# Patient Record
Sex: Female | Born: 1965 | Race: Black or African American | Hispanic: No | Marital: Married | Smoking: Never smoker
Health system: Southern US, Community
[De-identification: ages and names within clinical notes are randomized; demographics above are authoritative.]

## PROBLEM LIST (undated history)

## (undated) ENCOUNTER — Emergency Department (HOSPITAL_COMMUNITY): Disposition: A | Discharge status: Home / Self Care | Payer: BC Managed Care – PPO | Source: Home / Self Care

## (undated) DIAGNOSIS — E669 Obesity, unspecified: Secondary | ICD-10-CM

## (undated) DIAGNOSIS — G8929 Other chronic pain: Secondary | ICD-10-CM

## (undated) DIAGNOSIS — R195 Other fecal abnormalities: Secondary | ICD-10-CM

## (undated) DIAGNOSIS — J189 Pneumonia, unspecified organism: Secondary | ICD-10-CM

## (undated) DIAGNOSIS — R109 Unspecified abdominal pain: Secondary | ICD-10-CM

## (undated) DIAGNOSIS — R569 Unspecified convulsions: Secondary | ICD-10-CM

## (undated) DIAGNOSIS — I872 Venous insufficiency (chronic) (peripheral): Secondary | ICD-10-CM

## (undated) DIAGNOSIS — N838 Other noninflammatory disorders of ovary, fallopian tube and broad ligament: Secondary | ICD-10-CM

## (undated) DIAGNOSIS — K219 Gastro-esophageal reflux disease without esophagitis: Secondary | ICD-10-CM

## (undated) HISTORY — PX: CARPAL TUNNEL RELEASE: SHX101

## (undated) HISTORY — DX: Unspecified abdominal pain: R10.9

## (undated) HISTORY — DX: Venous insufficiency (chronic) (peripheral): I87.2

## (undated) HISTORY — DX: Obesity, unspecified: E66.9

## (undated) HISTORY — DX: Gastro-esophageal reflux disease without esophagitis: K21.9

## (undated) HISTORY — DX: Pneumonia, unspecified organism: J18.9

## (undated) HISTORY — DX: Other noninflammatory disorders of ovary, fallopian tube and broad ligament: N83.8

## (undated) HISTORY — DX: Other fecal abnormalities: R19.5

## (undated) HISTORY — PX: OTHER SURGICAL HISTORY: SHX169

## (undated) HISTORY — DX: Unspecified convulsions: R56.9

## (undated) HISTORY — DX: Other chronic pain: G89.29

---

## 1999-01-27 ENCOUNTER — Encounter: Payer: Self-pay | Admitting: Emergency Medicine

## 1999-01-27 ENCOUNTER — Emergency Department (HOSPITAL_COMMUNITY): Admission: EM | Admit: 1999-01-27 | Discharge: 1999-01-27 | Payer: Self-pay | Admitting: Emergency Medicine

## 1999-08-19 ENCOUNTER — Encounter (HOSPITAL_COMMUNITY): Admission: RE | Admit: 1999-08-19 | Discharge: 1999-11-17 | Payer: Self-pay | Admitting: Dentistry

## 1999-08-26 ENCOUNTER — Other Ambulatory Visit: Admission: RE | Admit: 1999-08-26 | Discharge: 1999-08-26 | Payer: Self-pay | Admitting: Family Medicine

## 1999-09-08 ENCOUNTER — Encounter (HOSPITAL_COMMUNITY): Payer: Self-pay | Admitting: Dentistry

## 1999-11-17 ENCOUNTER — Encounter (HOSPITAL_COMMUNITY): Admission: RE | Admit: 1999-11-17 | Discharge: 2000-02-15 | Payer: Self-pay | Admitting: Dentistry

## 2000-03-25 ENCOUNTER — Encounter (HOSPITAL_COMMUNITY): Admission: RE | Admit: 2000-03-25 | Discharge: 2000-06-23 | Payer: Self-pay | Admitting: Dentistry

## 2000-04-02 ENCOUNTER — Emergency Department (HOSPITAL_COMMUNITY): Admission: EM | Admit: 2000-04-02 | Discharge: 2000-04-02 | Payer: Self-pay | Admitting: Emergency Medicine

## 2001-08-06 ENCOUNTER — Emergency Department (HOSPITAL_COMMUNITY): Admission: EM | Admit: 2001-08-06 | Discharge: 2001-08-06 | Payer: Self-pay | Admitting: Emergency Medicine

## 2001-08-06 ENCOUNTER — Encounter: Payer: Self-pay | Admitting: Emergency Medicine

## 2001-09-21 ENCOUNTER — Ambulatory Visit (HOSPITAL_COMMUNITY): Admission: RE | Admit: 2001-09-21 | Discharge: 2001-09-21 | Payer: Self-pay | Admitting: *Deleted

## 2001-09-22 ENCOUNTER — Ambulatory Visit (HOSPITAL_COMMUNITY): Admission: AD | Admit: 2001-09-22 | Discharge: 2001-09-22 | Payer: Self-pay | Admitting: *Deleted

## 2001-09-22 ENCOUNTER — Encounter (INDEPENDENT_AMBULATORY_CARE_PROVIDER_SITE_OTHER): Payer: Self-pay

## 2002-01-07 ENCOUNTER — Emergency Department (HOSPITAL_COMMUNITY): Admission: EM | Admit: 2002-01-07 | Discharge: 2002-01-07 | Payer: Self-pay | Admitting: Emergency Medicine

## 2002-02-13 ENCOUNTER — Encounter: Admission: RE | Admit: 2002-02-13 | Discharge: 2002-03-15 | Payer: Self-pay | Admitting: Orthopedic Surgery

## 2002-10-31 ENCOUNTER — Encounter: Admission: RE | Admit: 2002-10-31 | Discharge: 2003-01-29 | Payer: Self-pay | Admitting: Family Medicine

## 2003-08-12 ENCOUNTER — Encounter: Admission: RE | Admit: 2003-08-12 | Discharge: 2003-08-12 | Payer: Self-pay | Admitting: Family Medicine

## 2003-10-29 ENCOUNTER — Other Ambulatory Visit: Admission: RE | Admit: 2003-10-29 | Discharge: 2003-10-29 | Payer: Self-pay | Admitting: Family Medicine

## 2004-07-28 ENCOUNTER — Ambulatory Visit: Payer: Self-pay | Admitting: Internal Medicine

## 2004-07-28 ENCOUNTER — Encounter (INDEPENDENT_AMBULATORY_CARE_PROVIDER_SITE_OTHER): Payer: Self-pay | Admitting: Internal Medicine

## 2004-07-28 LAB — CONVERTED CEMR LAB: Pap Smear: NORMAL

## 2004-08-03 ENCOUNTER — Emergency Department (HOSPITAL_COMMUNITY): Admission: EM | Admit: 2004-08-03 | Discharge: 2004-08-03 | Payer: Self-pay | Admitting: Family Medicine

## 2004-08-11 ENCOUNTER — Ambulatory Visit: Payer: Self-pay | Admitting: Internal Medicine

## 2004-08-19 ENCOUNTER — Encounter: Admission: RE | Admit: 2004-08-19 | Discharge: 2004-08-19 | Payer: Self-pay | Admitting: Internal Medicine

## 2004-09-16 ENCOUNTER — Encounter (INDEPENDENT_AMBULATORY_CARE_PROVIDER_SITE_OTHER): Payer: Self-pay | Admitting: Internal Medicine

## 2004-10-06 ENCOUNTER — Ambulatory Visit: Payer: Self-pay | Admitting: Internal Medicine

## 2004-12-21 ENCOUNTER — Ambulatory Visit: Payer: Self-pay | Admitting: Internal Medicine

## 2005-01-02 ENCOUNTER — Encounter (INDEPENDENT_AMBULATORY_CARE_PROVIDER_SITE_OTHER): Payer: Self-pay | Admitting: Internal Medicine

## 2005-01-05 ENCOUNTER — Ambulatory Visit (HOSPITAL_COMMUNITY): Admission: RE | Admit: 2005-01-05 | Discharge: 2005-01-05 | Payer: Self-pay | Admitting: Gastroenterology

## 2005-01-06 ENCOUNTER — Ambulatory Visit (HOSPITAL_COMMUNITY): Admission: RE | Admit: 2005-01-06 | Discharge: 2005-01-06 | Payer: Self-pay | Admitting: Gastroenterology

## 2005-01-06 ENCOUNTER — Ambulatory Visit: Payer: Self-pay | Admitting: Internal Medicine

## 2005-01-18 DIAGNOSIS — N838 Other noninflammatory disorders of ovary, fallopian tube and broad ligament: Secondary | ICD-10-CM

## 2005-01-18 HISTORY — DX: Other noninflammatory disorders of ovary, fallopian tube and broad ligament: N83.8

## 2005-01-27 ENCOUNTER — Ambulatory Visit: Payer: Self-pay | Admitting: Cardiology

## 2005-01-27 ENCOUNTER — Ambulatory Visit (HOSPITAL_COMMUNITY): Admission: RE | Admit: 2005-01-27 | Discharge: 2005-01-27 | Payer: Self-pay | Admitting: Internal Medicine

## 2005-01-27 ENCOUNTER — Encounter: Payer: Self-pay | Admitting: Cardiology

## 2005-01-29 ENCOUNTER — Ambulatory Visit: Payer: Self-pay | Admitting: Internal Medicine

## 2005-01-29 ENCOUNTER — Encounter (INDEPENDENT_AMBULATORY_CARE_PROVIDER_SITE_OTHER): Payer: Self-pay | Admitting: Internal Medicine

## 2005-02-01 ENCOUNTER — Ambulatory Visit (HOSPITAL_COMMUNITY): Admission: RE | Admit: 2005-02-01 | Discharge: 2005-02-01 | Payer: Self-pay | Admitting: Internal Medicine

## 2005-06-22 ENCOUNTER — Ambulatory Visit: Payer: Self-pay | Admitting: Internal Medicine

## 2005-08-23 ENCOUNTER — Ambulatory Visit (HOSPITAL_COMMUNITY): Admission: RE | Admit: 2005-08-23 | Discharge: 2005-08-23 | Payer: Self-pay | Admitting: Obstetrics & Gynecology

## 2005-09-16 ENCOUNTER — Ambulatory Visit (HOSPITAL_COMMUNITY): Admission: RE | Admit: 2005-09-16 | Discharge: 2005-09-16 | Payer: Self-pay | Admitting: Obstetrics & Gynecology

## 2005-10-20 ENCOUNTER — Ambulatory Visit (HOSPITAL_COMMUNITY): Admission: RE | Admit: 2005-10-20 | Discharge: 2005-10-20 | Payer: Self-pay | Admitting: Obstetrics & Gynecology

## 2005-11-17 ENCOUNTER — Encounter (INDEPENDENT_AMBULATORY_CARE_PROVIDER_SITE_OTHER): Payer: Self-pay | Admitting: Internal Medicine

## 2005-11-17 DIAGNOSIS — L538 Other specified erythematous conditions: Secondary | ICD-10-CM

## 2005-11-17 DIAGNOSIS — R609 Edema, unspecified: Secondary | ICD-10-CM | POA: Insufficient documentation

## 2005-11-17 DIAGNOSIS — E669 Obesity, unspecified: Secondary | ICD-10-CM

## 2005-11-27 ENCOUNTER — Encounter (INDEPENDENT_AMBULATORY_CARE_PROVIDER_SITE_OTHER): Payer: Self-pay | Admitting: Internal Medicine

## 2006-01-17 ENCOUNTER — Encounter (INDEPENDENT_AMBULATORY_CARE_PROVIDER_SITE_OTHER): Payer: Self-pay | Admitting: Internal Medicine

## 2006-01-25 DIAGNOSIS — N839 Noninflammatory disorder of ovary, fallopian tube and broad ligament, unspecified: Secondary | ICD-10-CM | POA: Insufficient documentation

## 2006-02-13 ENCOUNTER — Inpatient Hospital Stay (HOSPITAL_COMMUNITY): Admission: AD | Admit: 2006-02-13 | Discharge: 2006-02-13 | Payer: Self-pay | Admitting: Obstetrics & Gynecology

## 2006-02-21 ENCOUNTER — Ambulatory Visit: Payer: Self-pay | Admitting: Obstetrics & Gynecology

## 2006-02-26 ENCOUNTER — Inpatient Hospital Stay (HOSPITAL_COMMUNITY): Admission: AD | Admit: 2006-02-26 | Discharge: 2006-02-26 | Payer: Self-pay | Admitting: Obstetrics and Gynecology

## 2006-02-28 ENCOUNTER — Inpatient Hospital Stay (HOSPITAL_COMMUNITY): Admission: RE | Admit: 2006-02-28 | Discharge: 2006-03-05 | Payer: Self-pay | Admitting: Obstetrics and Gynecology

## 2006-02-28 ENCOUNTER — Ambulatory Visit: Payer: Self-pay | Admitting: Obstetrics & Gynecology

## 2006-03-02 ENCOUNTER — Encounter (INDEPENDENT_AMBULATORY_CARE_PROVIDER_SITE_OTHER): Payer: Self-pay | Admitting: *Deleted

## 2006-03-30 ENCOUNTER — Inpatient Hospital Stay (HOSPITAL_COMMUNITY): Admission: AD | Admit: 2006-03-30 | Discharge: 2006-03-30 | Payer: Self-pay | Admitting: Obstetrics and Gynecology

## 2006-03-30 ENCOUNTER — Ambulatory Visit: Payer: Self-pay | Admitting: Obstetrics and Gynecology

## 2006-10-25 ENCOUNTER — Emergency Department (HOSPITAL_COMMUNITY): Admission: EM | Admit: 2006-10-25 | Discharge: 2006-10-25 | Payer: Self-pay | Admitting: Family Medicine

## 2007-02-27 ENCOUNTER — Emergency Department (HOSPITAL_COMMUNITY): Admission: EM | Admit: 2007-02-27 | Discharge: 2007-02-27 | Payer: Self-pay | Admitting: Emergency Medicine

## 2007-08-01 ENCOUNTER — Ambulatory Visit: Payer: Self-pay | Admitting: Internal Medicine

## 2007-08-01 ENCOUNTER — Encounter: Payer: Self-pay | Admitting: Internal Medicine

## 2007-08-01 DIAGNOSIS — M79609 Pain in unspecified limb: Secondary | ICD-10-CM

## 2007-08-01 LAB — CONVERTED CEMR LAB
Albumin: 4.2 g/dL (ref 3.5–5.2)
Potassium: 4.4 meq/L (ref 3.5–5.3)
Total Protein: 7.4 g/dL (ref 6.0–8.3)

## 2008-01-30 ENCOUNTER — Ambulatory Visit: Payer: Self-pay | Admitting: Infectious Disease

## 2008-01-30 ENCOUNTER — Encounter (INDEPENDENT_AMBULATORY_CARE_PROVIDER_SITE_OTHER): Payer: Self-pay | Admitting: Internal Medicine

## 2008-02-01 ENCOUNTER — Encounter (INDEPENDENT_AMBULATORY_CARE_PROVIDER_SITE_OTHER): Payer: Self-pay | Admitting: Internal Medicine

## 2008-02-01 ENCOUNTER — Ambulatory Visit: Payer: Self-pay | Admitting: Internal Medicine

## 2008-02-06 ENCOUNTER — Telehealth (INDEPENDENT_AMBULATORY_CARE_PROVIDER_SITE_OTHER): Payer: Self-pay | Admitting: Internal Medicine

## 2008-02-06 DIAGNOSIS — R7309 Other abnormal glucose: Secondary | ICD-10-CM

## 2008-02-06 LAB — CONVERTED CEMR LAB
ALT: 16 units/L (ref 0–35)
AST: 19 units/L (ref 0–37)
Albumin: 4 g/dL (ref 3.5–5.2)
BUN: 9 mg/dL (ref 6–23)
CO2: 22 meq/L (ref 19–32)
Calcium: 9 mg/dL (ref 8.4–10.5)
Chloride: 106 meq/L (ref 96–112)
Cholesterol: 161 mg/dL (ref 0–200)
Creatinine, Ser: 0.99 mg/dL (ref 0.40–1.20)
Glucose, Bld: 108 mg/dL — ABNORMAL HIGH (ref 70–99)
Potassium: 4.5 meq/L (ref 3.5–5.3)
Sodium: 140 meq/L (ref 135–145)
Total Bilirubin: 0.4 mg/dL (ref 0.3–1.2)

## 2008-02-09 ENCOUNTER — Encounter (INDEPENDENT_AMBULATORY_CARE_PROVIDER_SITE_OTHER): Payer: Self-pay | Admitting: *Deleted

## 2008-02-10 ENCOUNTER — Emergency Department (HOSPITAL_COMMUNITY): Admission: EM | Admit: 2008-02-10 | Discharge: 2008-02-10 | Payer: Self-pay | Admitting: Emergency Medicine

## 2008-02-14 ENCOUNTER — Ambulatory Visit: Payer: Self-pay | Admitting: Internal Medicine

## 2008-02-14 LAB — CONVERTED CEMR LAB: Hgb A1c MFr Bld: 5 %

## 2008-07-30 ENCOUNTER — Observation Stay (HOSPITAL_COMMUNITY): Admission: EM | Admit: 2008-07-30 | Discharge: 2008-07-31 | Payer: Self-pay | Admitting: Emergency Medicine

## 2008-07-30 ENCOUNTER — Telehealth (INDEPENDENT_AMBULATORY_CARE_PROVIDER_SITE_OTHER): Payer: Self-pay | Admitting: *Deleted

## 2008-11-22 ENCOUNTER — Other Ambulatory Visit: Admission: RE | Admit: 2008-11-22 | Discharge: 2008-11-22 | Payer: Self-pay | Admitting: Family Medicine

## 2009-04-14 ENCOUNTER — Encounter: Payer: Self-pay | Admitting: Internal Medicine

## 2009-04-15 ENCOUNTER — Ambulatory Visit: Payer: Self-pay | Admitting: Internal Medicine

## 2009-04-15 LAB — CONVERTED CEMR LAB
Anticardiolipin IgA: 4 (ref <22)
Anticardiolipin IgM: 0 (ref <11)

## 2009-04-23 LAB — CONVERTED CEMR LAB
ANA Titer 1: 1:40 {titer} — ABNORMAL HIGH
Albumin: 4.2 g/dL (ref 3.5–5.2)
CO2: 22 meq/L (ref 19–32)
CRP: 1.2 mg/dL — ABNORMAL HIGH (ref <0.6)
Calcium: 9.5 mg/dL (ref 8.4–10.5)
Chloride: 103 meq/L (ref 96–112)
HCT: 39.5 % (ref 36.0–46.0)
MCHC: 32.4 g/dL (ref 30.0–36.0)
MCV: 84.8 fL (ref >=78.0)
Potassium: 4.2 meq/L (ref 3.5–5.3)
Sodium: 138 meq/L (ref 135–145)
Total Bilirubin: 0.7 mg/dL (ref 0.3–1.2)

## 2009-05-07 ENCOUNTER — Ambulatory Visit (HOSPITAL_COMMUNITY): Admission: RE | Admit: 2009-05-07 | Discharge: 2009-05-07 | Payer: Self-pay | Admitting: Internal Medicine

## 2009-05-19 ENCOUNTER — Telehealth (INDEPENDENT_AMBULATORY_CARE_PROVIDER_SITE_OTHER): Payer: Self-pay | Admitting: Internal Medicine

## 2009-07-01 ENCOUNTER — Ambulatory Visit: Payer: Self-pay | Admitting: Internal Medicine

## 2009-08-20 ENCOUNTER — Ambulatory Visit: Payer: Self-pay | Admitting: Internal Medicine

## 2009-08-26 ENCOUNTER — Ambulatory Visit: Payer: Self-pay | Admitting: Internal Medicine

## 2010-02-07 ENCOUNTER — Encounter: Payer: Self-pay | Admitting: Internal Medicine

## 2010-02-09 ENCOUNTER — Encounter: Payer: Self-pay | Admitting: Emergency Medicine

## 2010-02-11 ENCOUNTER — Ambulatory Visit: Admission: RE | Admit: 2010-02-11 | Discharge: 2010-02-11 | Payer: Self-pay | Source: Home / Self Care

## 2010-02-11 DIAGNOSIS — B351 Tinea unguium: Secondary | ICD-10-CM | POA: Insufficient documentation

## 2010-02-12 ENCOUNTER — Emergency Department (HOSPITAL_COMMUNITY)
Admission: EM | Admit: 2010-02-12 | Discharge: 2010-02-13 | Payer: Self-pay | Source: Home / Self Care | Admitting: Emergency Medicine

## 2010-02-12 LAB — POCT I-STAT, CHEM 8
Calcium, Ion: 1.18 mmol/L (ref 1.12–1.32)
Chloride: 104 mEq/L (ref 96–112)
Hemoglobin: 12.9 g/dL (ref 12.0–15.0)
TCO2: 27 mmol/L (ref 0–100)

## 2010-02-12 LAB — POCT CARDIAC MARKERS
CKMB, poc: <1 ng/mL — ABNORMAL LOW (ref 1.0–8.0)
Myoglobin, poc: 72.7 ng/mL (ref 12–200)

## 2010-02-12 LAB — CBC
Hemoglobin: 11.3 g/dL — ABNORMAL LOW (ref 12.0–15.0)
MCH: 27 pg (ref 26.0–34.0)
Platelets: 353 10*3/uL (ref 150–400)
RBC: 4.19 MIL/uL (ref 3.87–5.11)
RDW: 13.8 % (ref 11.5–15.5)

## 2010-02-12 LAB — DIFFERENTIAL
Eosinophils Absolute: 0.1 10*3/uL (ref 0.0–0.7)
Eosinophils Relative: 1 % (ref 0–5)
Lymphocytes Relative: 49 % — ABNORMAL HIGH (ref 12–46)
Lymphs Abs: 3.4 10*3/uL (ref 0.7–4.0)
Neutro Abs: 3 10*3/uL (ref 1.7–7.7)
Neutrophils Relative %: 44 % (ref 43–77)

## 2010-02-17 NOTE — Assessment & Plan Note (Signed)
Summary: EXTREME M/O CLASS/CH   Allergies: 1)  ! * Shrimp 2)  ! Margette Fast Sauce Patient attended a 1 hour [employer] Makeover: Lifestyle meeting today. The meeting included information about: healthy food choices - more fruits and vegetables, healthy fats, healthy snacks on a budget- made trail mix and hand weights for them take home today. .   Please ask patient what they learned at their next visit.   Thank you for the referral.   Complete Medication List: 1)  Womens Multivitamin Plus Tabs (Multiple vitamins-minerals) .... Take 1 tablet by mouth once a day  Other Orders: No Charge Patient Arrived (NCPA0) (NCPA0)

## 2010-02-17 NOTE — Miscellaneous (Signed)
Summary: PPD 2007 negative  vaccine record   Imported By: Margie Billet 01/17/2006 15:59:59  _____________________________________________________________________  External Attachment:    Type:   Image     Comment:   External Document

## 2010-02-17 NOTE — Assessment & Plan Note (Signed)
Summary: FEET SWELLING/GHERGHE/VS   Vital Signs:  Patient Profile:   45 Years Old Female Weight:      327.1 pounds (148.68 kg) Temp:     98.4 degrees F (36.89 degrees C) oral Pulse rate:   82 / minute BP sitting:   126 / 83  (right arm)  Pt. in pain?   yes    Location:   2-3    Intensity:   2-3  Vitals Entered By: Stanton Kidney Ditzler RN (August 01, 2007 10:56 AM)              Is Patient Diabetic? No Nutritional Status Detail appetite good  Have you ever been in a relationship where you felt threatened, hurt or afraid?denies   Does patient need assistance? Functional Status Self care Ambulation Normal     Chief Complaint:  Legs swollen. Heel pain past 2 months. Raw on right side low abd in folds..  History of Present Illness: 45 yo female with PMH of obesity, lower extremity edema (extensive work up done, 2D ECHO LVEF 55% - 65%) came in with two concerns. First concern is bilateral heel pain since 2 months ago. Pain is continuous, nonradiating, and 5/10 in severity. It is worse with proloned walking, exercise, and at night time and it gets better with rest and ibuprofen. She has never had this pain before but reports gaining weight (about 50 lbs) in last couple of years after her pregnancy and she believes that weight makes it worse. She denies history of recent trauma to the feeet. She reports no difficulty walking, muscle weakness, no fever, chills, no chest pain, shortness of breath, dyspnea on exertion, nausea or vomiting.  Her second concern is lower extremity swelling that has been present for 2-3 years. It comes and goes. 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 She denies shortness of breath, chest pain, diaphoresis.    Current Allergies (reviewed today): ! * SHRIMP ! * TERIYAKI SAUCE  Past Medical History:    Abd. pain (once a month, lasts for 3-4 days-could have been related to IUD,  multiple visits for this)     Lower extremity edema worked up extensively (nl 2D ECHO LVEF 55 - 65%, nl LFTs, nl renal fxn) felt 2nd to vv insuff, but patient refused compression stockings    Obesity    Candidal intertrigo-under breasts    Left ovary enlargement 4.7/3 cm on CT 01/2005    Miscarriage at 3 mth 2003    GERD    h/o heme positive stool - Colonoscopy done, Barium enema 12/2004: diverticulosis in sigmoid colon, EGD     12/2004: normal   Social History:    lives at home with kids, currently working as bus driver   Risk Factors:  Tobacco use:  never Passive smoke exposure:  no Drug use:  no Alcohol use:  no  PAP Smear History:    Date of Last PAP Smear:  07/28/2004   Review of Systems  The patient denies fever, weight loss, weight gain, chest pain, syncope, dyspnea on exertion, headaches, abdominal pain, melena, muscle weakness, suspicious skin lesions, difficulty walking, and unusual weight change.         Patient gained weight since her pregnancy but no recent weight gain.   Physical Exam  General:     alert, normal appearance, and overweight-appearing.   Lungs:     normal respiratory  effort, no intercostal retractions, no accessory muscle use, normal breath sounds, no crackles, and no wheezes.   Heart:     normal rate, regular rhythm, no murmur, no gallop, no rub, and no JVD.   Abdomen:     soft, non-tender, and normal bowel sounds.   Msk:     normal ROM, no joint tenderness, no joint swelling, no joint warmth, no redness over joints, and no joint instability.   Pulses:     R posterior tibial normal, R dorsalis pedis normal, L posterior tibial normal, and L dorsalis pedis normal.   Extremities:     1+ left pedal edema, left pretibial edema, 1+ right pedal edema, and right pretibial edema.   Neurologic:     alert & oriented X3, cranial nerves II-XII intact, strength normal in all extremities, sensation intact to light touch, sensation intact to pinprick, and  gait normal.   Skin:     turgor normal, color normal, no rashes, and no suspicious lesions.   Inguinal Nodes:     no R inguinal adenopathy and no L inguinal adenopathy.   Psych:     Oriented X3, normally interactive, good eye contact, and not anxious appearing.      Impression & Recommendations:  Problem # 1:  HEEL PAIN, BILATERAL (ICD-729.5) Patient reports that this is new pain that has been going on for about 2 months now. It is continuous and nonradiation, gets worse with prolonged standing and it is relieved by rest and Adivl. On physical exam, there is full range of motion in lower extremities and strength 5/5. Dorsalis pedis and posterior tibial pulses are present bilaterally. There is pretibial swelling, +1 edema bilaterally. There is  no sign of trauma to the feet andno signs of infection (no redness or swelling). Patient was advised to wear comfortable shoes, avoiding flip-flops, and to take ibuprofen as needed for pain. I will see her in clinic in 2 months for follow up.  Problem # 2:  LEG EDEMA, BILATERAL (ICD-782.3) Patient has been having bilateral leg swelling for 2-3 years and she explains that it has been about same, sometimes gets worse with prolonged standing and sitting and it gets better with rest. She had extensive workup done in 2007, 2D ECHO showed LVEF of 55% - 65%, there was presumptive diagnosis of venous insufficiency but I was not able to find any records for this. Today she reports no chest pain or shortness of breath, no fever or chills. I have ordered c-met and TSH to recheck the leves becasue it has been 2 years when she last had it done. I have discussed elevation of the legs, use of compression stockings, and sodium restiction in her diet. I will see patient in 2 months and will follow up with her swelling.  Orders: T-Comprehensive Metabolic Panel ([id number]) T-TSH [id number])   Complete Medication List: 1)  [facility] Ibuprofen 200 Mg Tabs (Ibuprofen) ....  Take 2-3 tablets by mouth as needed for pain 2)  Clotrimazole-betamethasone 1-0.05 % Crea (Clotrimazole-betamethasone) .... Apply tothe area as needed   Patient Instructions: 1)  Please schedule a follow-up appointment in 2 months. 2)  Use compression stockings because they can help with the swelling. Also, use comfortable shoes at work or for exercise and avoid flip-flops.Marland Kitchen 3)  If the pain gets worse please come back to the clinic.   Prescriptions: CLOTRIMAZOLE-BETAMETHASONE 1-0.05 %  CREA (CLOTRIMAZOLE-BETAMETHASONE) apply tothe area as needed  #4 x 0   Entered and Authorized by:  Mliss Sax MD   Signed by:   Mliss Sax MD on 08/01/2007   Method used:   Print then Give to Patient   RxID:   [id number] [facility] IBUPROFEN 200 MG  TABS (IBUPROFEN) take 2-3 tablets by mouth as needed for pain  #90 x 0   Entered and Authorized by:   Mliss Sax MD   Signed by:   Mliss Sax MD on 08/01/2007   Method used:   Print then Give to Patient   RxID:   [id number]  ]

## 2010-02-17 NOTE — Assessment & Plan Note (Signed)
Summary: est-ck/fu/meds/cfb   Vital Signs:  Patient profile:   45 year old female Height:      65.25 inches (165.74 cm) Weight:      320.7 pounds (145.77 kg) BMI:     53.15 Temp:     97.7 degrees F (36.50 degrees C) oral Pulse rate:   68 / minute BP sitting:   133 / 88  (right arm)  Vitals Entered By: Stanton Kidney Ditzler RN (April 15, 2009 11:13 AM) Is Patient Diabetic? No Pain Assessment Patient in pain? no      Nutritional Status BMI of > 30 = obese Nutritional Status Detail appetite good  Have you ever been in a relationship where you felt threatened, hurt or afraid?denies   Does patient need assistance? Functional Status Self care Ambulation Normal Comments FU left leg. Past 3 weeks nonproductive cough.   Primary Care Provider:  Carlus Pavlov MD   History of Present Illness: Pt is a 45 yo female w/ past med hx below here for vein problems in her legs.  She noticed this area about 3 weeks ago when she was getting out of the shower.  Skin is discolored on the lateral aspect of her L calf.  The area is not painful, no tingling or numbness, no fevers or other systemic complaints.  She is upset about the way the area looks cosmetically.  She has no other complaints.    She is due for her pap smear and [facility]  Depression History:      The patient denies a depressed mood most of the day and a diminished interest in her usual daily activities.         Preventive Screening-Counseling & Management  Alcohol-Tobacco     Smoking Status: never     Passive Smoke Exposure: no  Current Medications (verified): 1)  None  Allergies: 1)  ! * Shrimp 2)  ! * Teriyaki Sauce  Past History:  Past Medical History: Last updated: 08/01/2007 Abd. pain (once a month, lasts for 3-4 days-could have been related to IUD, multiple visits for this)  Lower extremity edema worked up extensively (nl 2D ECHO LVEF 55 - 65%, nl LFTs, nl renal fxn) felt 2nd to vv insuff, but  patient refused compression stockings Obesity Candidal intertrigo-under breasts Left ovary enlargement 4.7/3 cm on CT 01/2005 Miscarriage at 3 mth 2003 GERD h/o heme positive stool - Colonoscopy done, Barium enema 12/2004: diverticulosis in sigmoid colon, EGD     12/2004: normal  Past Surgical History: Last updated: 11/17/2005 Caesarean section Carpal tunnel release  Social History: Last updated: 01/30/2008 lives at home with kids, currently working as bus Musician.  No smoking, alcohol or illegal drugs.  She is single.   Social History: Reviewed history from 01/30/2008 and no changes required. lives at home with kids, currently working as bus Musician.  No smoking, alcohol or illegal drugs.  She is single.   Review of Systems       as per hpi.  Physical Exam  General:  alert, oriented, obese, no distress.  Eyes:  anicteric.  Lungs:  CTA bilaterally, no resp effort.  Heart:  RRR, no m/r/g. Abdomen:  +BS's, soft, obese, NT and ND.  Extremities:  trace lower extremity edema.  2+ DP pulses.  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  Psych:  mood euthymic.  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  Could be related to mod size venous dilation.  However, per up to date, this is associated w/ various autoimmune deficiencies.  Will screen for lupus, APA syn, rheumatic dz, check inflammatory markers, f/u CBC, adn CMET to investigate possible systemic causes.  If it is neg, could consider bx to confirm dx vs observation.  Will f/u in 2 weeks and go over labs.     Orders: T-Sed Rate (Automated) [id number]) T-C-Reactive Protein [id number]) T-Antinuclear Antib (ANA) [phone]) T- * Misc. Laboratory test [id number]) T-Rheumatoid Factor [phone]) T-CBC No Diff  [id number]) T-Comprehensive Metabolic Panel [id number]) T- * Misc. Laboratory test [phone])  Problem # 2:  Preventive Health Care (ICD-V70.0) [facility]  Reminded her she is overdue.  Mammogram order placed.   Other Orders: Mammogram (Screening) (Mammo)  Patient Instructions: 1)  Please make a followup appointment in 2 weeks. 2)  Please apply TED hose to your leg to see if this helps.  Process Orders Check Orders Results:     [facility]: ABN not required for this insurance Tests Sent for requisitioning (April 16, 2009 12:17 PM):     04/15/2009: [facility] -- T-Sed Rate (Automated) [[id number]] (signed)     04/15/2009: [facility] -- T-C-Reactive Protein [id number] (signed)     04/15/2009: [facility] -- T-Antinuclear Antib (ANA) [[id number]] (signed)     04/15/2009: [facility] -- T- * Misc. Laboratory test [[id number]] (signed)     04/15/2009: [facility] -- T-Rheumatoid Factor [phone] (signed)     04/15/2009: [facility] -- T-CBC No Diff [[id number]] (signed)     04/15/2009: [facility] -- T-Comprehensive Metabolic Panel [[id number]] (signed)     04/15/2009: [facility] -- T- * Misc. Laboratory test [phone] (signed)    Prevention & Chronic Care Immunizations   Influenza vaccine: Not documented    Tetanus booster: Not documented    Pneumococcal vaccine: Not documented  Other Screening   Pap smear: Normal  (07/28/2004)   Pap smear action/deferral: Deferred  (04/15/2009)    Mammogram: Not documented   Mammogram action/deferral: Ordered  (04/15/2009)   Smoking status: never  (04/15/2009)  Lipids   Total Cholesterol: 161  (02/01/2008)   LDL: 99  (02/01/2008)   LDL Direct: Not documented   HDL: 33  (02/01/2008)   Triglycerides: 143  (02/01/2008)      Resource handout printed.   Nursing  Instructions: Schedule screening mammogram (see order)

## 2010-02-17 NOTE — Letter (Signed)
Summary: SCREENING MAMMOGRAM  SCREENING MAMMOGRAM   Imported By: Margie Billet 04/15/2009 08:55:59  _____________________________________________________________________  External Attachment:    Type:   Image     Comment:   External Document  Appended Document: SCREENING MAMMOGRAM    Clinical Lists Changes  Observations: Added new observation of MAMMO DUE: 08/2005 (04/15/2009 13:24) Added new observation of MAMMOGRAM: No specific mammographic evidence of malignancy.   (08/19/2004 13:25)       Mammogram  Procedure date:  08/19/2004  Findings:      No specific mammographic evidence of malignancy.    Procedures Next Due Date:    Mammogram: 08/2005   Mammogram  Procedure date:  08/19/2004  Findings:      No specific mammographic evidence of malignancy.    Procedures Next Due Date:    Mammogram: 08/2005

## 2010-02-17 NOTE — Assessment & Plan Note (Signed)
Summary: tb inj/cfb  Nurse Visit   Allergies: 1)  ! * Shrimp 2)  ! * Teriyaki Sauce  Immunizations Administered:  PPD Skin Test:    Vaccine Type: PPD    Site: right forearm    Mfr: Sanofi Pasteur    Dose: 0.05 ml    Route: ID    Given by: [email or web address]    Exp. Date: 10/31/2010    Lot #: [id number]  Orders Added: 1)  TB Skin Test [86580] 2)  Admin 1st Vaccine [90471] placed 07/01/2009 [facility]  Orders Added: 1)  TB Skin Test [86580] 2)  Admin 1st Vaccine [90471]    PPD Results    Date of reading: 07/03/2009    Results: < 5mm    Interpretation: negative

## 2010-02-17 NOTE — Assessment & Plan Note (Signed)
Summary: ACUTE-SWELLING IN FEET/WANTS REF TO GYN/BOWERS/CFB   Vital Signs:  Patient profile:   45 year old female Height:      65.25 inches (165.74 cm) Weight:      324.4 pounds (147.45 kg) BMI:     53.76 Temp:     98.2 degrees F (36.78 degrees C) oral Pulse rate:   84 / minute BP sitting:   103 / 66  (left arm)  Vitals Entered By: Stanton Kidney Ditzler RN (August 20, 2009 10:19 AM) Is Patient Diabetic? No Pain Assessment Patient in pain? no      Nutritional Status BMI of > 30 = obese Nutritional Status Detail appetite good  Have you ever been in a relationship where you felt threatened, hurt or afraid?denies   Does patient need assistance? Functional Status Self care Ambulation Normal Comments Past 4 months - swelling and pain both feet. Needs referral [facility] GYN clinic for BTL.   Primary Care Provider:  Carlus Pavlov MD   History of Present Illness: Patient is a 45 y/o woman with no major medical problems who come with complaint of recent increase in swelling in her feet.  For the past 13 years or so the patient has had trouble with swelling in her feet and ankles.  It has periods of being better, worse in the summer. Three years ago she regained 70 pounds that she lost after hainvg a baby and her feet have been worse since then.   The last 4 months it has been worse.   Pain is worse at night when she gets up in the middle of the night, feels like a knife, at times accompanied by tingling.  Pain is symmetric bilaterally.  The swelling is prediominatly in her feet and ankles and is equal bilaterally.  Worse if she stays on her feet all day. Patient feels some relief from heel pain by rubbing frozen can under bottom of feet.  Patient has not tried elevating feet, says she doesn't have much time for that.  When she wakes up in the morning the swelling is improved.   She has been told in the past to try compression stockings, but she won't do that until she is "older, maybe in my 45s".  No  SOB, CP, abdominal pain, N/V, headaches, trouble sleeping, fevers, cold symptoms, lost weight, night sweats, or joint pain.    Patient has h/o carpal tunnel in both hands and did experience some numbness and tingling in both hands once recently but she attributes this to sleeping on them wrong.    Patient would like to start exercising (just got membership at the [facility]) and is going to rejoin weight watchers as she would like to lose weight; she believes this would not only help her current problem but also help prevent the development of future medical problems.  She is concerned because her mother had DM and HTN and died of renal failure 2/2 diabetes.    Also, patient would like to get her tubes tied and would like a referral to gyn for this.    Depression History:      The patient denies a depressed mood most of the day and a diminished interest in her usual daily activities.         Preventive Screening-Counseling & Management  Alcohol-Tobacco     Smoking Status: never     Passive Smoke Exposure: no  Current Problems (verified): 1)  Multiparity  (ICD-V61.5) 2)  Screening Examination For Pulmonary Tuberculosis  (  ICD-V74.1) 3)  Hyperglycemia  (ICD-790.29) 4)  Heel Pain, Bilateral  (ICD-729.5) 5)  Disease, Noninflammatory, Ovary/tube Nos  (ICD-620.9) 6)  Intertrigo, Candidal  (ICD-695.89) 7)  Obesity Nos  (ICD-278.00) 8)  Leg Edema, Bilateral  (ICD-782.3)  Current Medications (verified): 1)  Womens Multivitamin Plus  Tabs (Multiple Vitamins-Minerals) .... Take 1 Tablet By Mouth Once A Day  Allergies: 1)  ! * Shrimp 2)  ! Bud Face  Past History:  Past Surgical History: Last updated: 11/17/2005 Caesarean section Carpal tunnel release  Past Medical History: Abd. pain (once a month, lasts for 3-4 days-could have been related to IUD, multiple visits for this, now resolved)  Lower extremity edema worked up extensively (nl 2D ECHO LVEF 55 - 65%, nl LFTs, nl renal fxn)  felt 2nd to vv insuff, but patient refused compression stockings Obesity Candidal intertrigo-under breasts Left ovary enlargement 4.7/3 cm on CT 01/2005 Miscarriage at 3 mth 2003 GERD h/o heme positive stool - Colonoscopy done, Barium enema 12/2004: diverticulosis in sigmoid colon, EGD     12/2004: normal  Family History: Family History Kidney disease (renal failure 2/2 diabetes, mother) Family History of Cardiovascular disorder (CHF, father(from alcoholism) and mother) Family History Hypertension (mother and father) Family History of Rheumatoid Arthritis (mother)  Social History: Lives at home with 95 year old child, currently working as school bus Musician.  Has a 22 y/o son who just graduated from [employer].  The patient is going to school for a business degree.  No smoking, alcohol or illegal drugs.  She is single and has not been sexually active since the birth of her 45 year-old.   Review of Systems       per HPI  Physical Exam  General:  alert, appropriate dress, normal appearance, cooperative to examination, and overweight-appearing.   Head:  no abnormalities observed.   Eyes:  pupils equal, pupils round, and pupils reactive to light.   Ears:  no external deformities.   Nose:  no external deformity, no external erythema, and no nasal discharge.   Mouth:  pharynx pink and moist and teeth missing.   Neck:  supple and full ROM.   Lungs:  normal respiratory effort, no accessory muscle use, normal breath sounds, no crackles, and no wheezes.   Heart:  normal rate, regular rhythm, and no murmur.   Abdomen:  soft, non-tender, and normal bowel sounds.   Msk:  normal ROMand no joint tenderness.   Extremities:  symmetric trace bilateral pedal edema with symmetric 1+ pitting pretibial edema bilaterally.  Some tenderness to palpation on medial aspect of heel; otherwise non-tender.  no posterior calf tenderness. Neurologic:  alert & oriented X3, strength normal in all extremities, and  sensation intact to light touch.   Psych:  Oriented X3, normally interactive, and good eye contact.     Impression & Recommendations:  Problem # 1:  LEG EDEMA, BILATERAL (ICD-782.3) Patient has h/o long-standing bilateral, symmetric, non-tender lower leg edema.  She had a work-up for this including 2D-ECHO in 2007 that showed normal cardiac function.  She was counseled to try to decrease the amount of salt in her diet and elevate her legs when possible.  Compression stockings were suggested, however she is not interested in using these.    Problem # 2:  OBESITY NOS (ICD-278.00) Patient has gained approximately 4 pounds since she was last seen in the clinic in March 2011.  She understands the effects of her weight on her current and future health,  and she is motivated to make a change.  She lost 76 pounds before becoming pregnant with her last child, and she is confident that she can do this again.  She has already joined and started working out at [employer], and she is planning on joining [employer].  She has been to [facility] healthy living class before in 2010; she is interested in attending this again. She is to follow-up with me in 2-3 months so that we can track her progress and start setting some goals for her weight loss.  Problem # 3:  HEEL PAIN, BILATERAL (ICD-729.5) The patient has a history of bilateral heel pain that started after regained 76 pounds after the birth of her second child 3 years ago.  Her pain will likely improve with weight loss (see previous problem), however in the meantime she was instructed to use heat or ice as per her preference and to try ibuprofen.    Problem # 4:  MULTIPARITY (ICD-V61.5) Patient would like to have her tubes tied as she is not interested in having any more children.  She currently has an IUD in place, but she states that she would like a permanent method of birth control.  She was given a referral to a gynecologist for evaluation for  BTL.    Orders: Gynecologic Referral (Gyn)  Problem # 5:  Screening Cervical Cancer (ICD-V76.2) The patient's last Pap smear was approximately 2 years ago (per patient report, in our records, her last pap was in 2006); if the pap is not performed at the gyne appointment, then she will need a pap smear. It seems that she has been to the health department for her pap smears in the past; if she would prefer to return there, that is fine, however, we can take care of this the next time she comes to clinic in 2-3 months for follow-up.  Complete Medication List: 1)  Womens Multivitamin Plus Tabs (Multiple vitamins-minerals) .... Take 1 tablet by mouth once a day  Patient Instructions: 1)  Today you were evaluated for swelling in your legs and pain in your heels.  We recommend that you try to reduce the amount of salt in your diet, try to elevate your feet, and use heat or ice if you have pain in your heels.   2)  We have here at the [facility] a healthy lifestyles class run by Jam[facility] [employer]ighbor; please speak with the folks at the front desk about attending one of these classes. 3)  Please follow-up with me in 2-3 months so that we can monitor how you are doing with regards to your leg swelling and weight loss.  4)  We will provide you with a referral to an OB/GYN physician for your bilateral tubal ligation.   5)  It is important that you exercise regularly at least 20 minutes 5 times a week. If you develop chest pain, have severe difficulty breathing, or feel very tired , stop exercising immediately and seek medical attention. 6)  You need to lose weight. Consider a lower calorie diet and regular exercise.    Prevention & Chronic Care Immunizations   Influenza vaccine: Not documented    Tetanus booster: Not documented    Pneumococcal vaccine: Not documented  Other Screening   Pap smear: Normal  (07/28/2004)   Pap smear action/deferral: Deferred  (04/15/2009)    Mammogram: ASSESSMENT: Negative - BI-RADS  1^MS DIGITAL SCREENING  (05/07/2009)   Mammogram action/deferral: Ordered  (04/15/2009)  Mammogram due: 08/2005   Smoking status: never  (08/20/2009)  Lipids   Total Cholesterol: 161  (02/01/2008)   LDL: 99  (02/01/2008)   LDL Direct: Not documented   HDL: 33  (02/01/2008)   Triglycerides: 143  (02/01/2008)      Resource handout printed.

## 2010-02-17 NOTE — Progress Notes (Signed)
  Phone Note Outgoing Call   Summary of Call: I spoke w/ pt about her lab results.  I spoke w/ Dr. Mayford Knife w/ [facility] dermatology about pt's exam and he suggested that she may have numular eczema as opposed to levido reticularis and recommended a trial of lidex cream.  I will send this in for the pt.  I informed her of her nl workup except slightly + ana but noted she has no other abnormalities to suggest lupus so we do not need to further investigate this right now.  Initial call taken by: Joaquin Courts  MD,  May 19, 2009 8:50 AM  New Problems: ? of LIVEDO RETICULARIS (ICD-782.61)   New Problems: ? of LIVEDO RETICULARIS (ICD-782.61) New/Updated Medications: FLUOCINONIDE 0.05 % CREA (FLUOCINONIDE) Apply to affected area twice a day. Prescriptions: FLUOCINONIDE 0.05 % CREA (FLUOCINONIDE) Apply to affected area twice a day.  #45 g x 0   Entered and Authorized by:   Joaquin Courts  MD   Signed by:   Joaquin Courts  MD on 05/19/2009   Method used:   Electronically to        [facility] [phone]* (retail)       [address]       [place], [place]  [address]       Ph: [phone]       Fax: [phone]   RxID:   [id number]

## 2010-02-18 ENCOUNTER — Other Ambulatory Visit: Payer: Self-pay | Admitting: *Deleted

## 2010-02-19 NOTE — Assessment & Plan Note (Addendum)
Summary: ACUTE-KNEE PAIN F/U/CFB([facility])/CFB   Vital Signs:  Patient profile:   45 year old female Height:      65.25 inches (165.74 cm) Weight:      321.2 pounds (146.00 kg) BMI:     53.23 Temp:     97.6 degrees F (36.44 degrees C) oral Pulse rate:   77 / minute BP sitting:   123 / 84  (left arm)  Vitals Entered By: Stanton Kidney Ditzler RN (February 11, 2010 11:10 AM) Is Patient Diabetic? No Pain Assessment Patient in pain? yes     Location: below left knee Intensity: 7 Type: aching Onset of pain  past month Nutritional Status BMI of > 30 = obese Nutritional Status Detail appetite good  Have you ever been in a relationship where you felt threatened, hurt or afraid?denies   Does patient need assistance? Functional Status Self care Ambulation Normal Comments Ck left leg below knee - gives out and aching past month.   Primary Care Provider:  Danelle Berry, MD   History of Present Illness: 45 y/o w with no major PMH comes to the clinic c/o foot pain and leg pain  This has been going since 3 monhts. 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     Depression History:      The patient denies a depressed mood most of the day and a diminished interest in her usual daily activities.         Preventive Screening-Counseling & Management  Alcohol-Tobacco     Smoking Status: never     Passive Smoke Exposure: no  Current Medications (verified): 1)  Womens Multivitamin Plus  Tabs  (Multiple Vitamins-Minerals) .... Take 1 Tablet By Mouth Once A Day 2)  Itraconazole 100 Mg Caps (Itraconazole) .... 2 Tablets Daily For 12 Weeks  Allergies: 1)  ! * Shrimp 2)  ! * Teriyaki Sauce  Review of Systems  The patient denies anorexia, fever, weight loss, weight gain, vision loss, decreased hearing, hoarseness, chest pain, syncope, dyspnea on exertion, peripheral edema, prolonged cough, headaches, hemoptysis, abdominal pain, melena, hematochezia, severe indigestion/heartburn, hematuria, incontinence, genital sores, muscle weakness, suspicious skin lesions, transient blindness, difficulty walking, depression, unusual weight change, abnormal bleeding, enlarged lymph nodes, angioedema, breast masses, and testicular masses.    Physical Exam  General:  Gen: VS reveiwed, Alert, well developed, nodistress ENT: mucous membranes pink & moist. No abnormal finds in ear and nose. CVC:S1 S2 , no murmurs, no abnormal heart sounds. Lungs: Clear to auscultation B/L. No wheezes, crackles or other abnormal sounds Abdomen: soft, non distended, no tender. Normal Bowel sounds EXT: b/l swollen ankle, , no engorged veins, Pulsations normal good ROM on no tnederness in the affected leg. no redness or rashes. b/l toenail fungal infection  Neuro:alert, oriented *3, cranial nerved 2-12 intact, strenght normal in all  extremities, senstations normal to light touch.      Impression & Recommendations:  Problem # 1:  FOOT PAIN, LEFT (  ICD-729.5) given her h/o b/l heel pain, foot pain and leg pain now, and obesity- musculoskeletal pain is more likely,. she had unrevealing ultrasound and test for lupus in the past. She does have some flat foot on physical exam although I am not sure how much of this is causing her leg pain. Will refer her to sports medicine for assessing the need for orthotics. Also counselled for wt reduction which she is trying hard to.  Continue ibuporfen as needed for pain as it is helping  her now.   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   Her updated medication list for this problem includes:    Itraconazole 100 Mg Caps (Itraconazole) .Marland Kitchen... 2 tablets daily for 12 weeks  Complete Medication List: 1)  Womens Multivitamin Plus Tabs (Multiple vitamins-minerals) .... Take 1 tablet by mouth once a day 2)  Itraconazole 100 Mg Caps (Itraconazole) .... 2 tablets daily for 12 weeks  Patient Instructions: 1)  Please schedule a follow-up appointment in 1 month. Prescriptions: ITRACONAZOLE 100 MG CAPS (ITRACONAZOLE) 2 tablets daily for 12 weeks  #168 x 0   Entered and Authorized by:   Bethel Born MD   Signed by:   Bethel Born MD on 02/11/2010   Method used:   Electronically to        [facility] [phone]* (retail)       [address]       [place], [place]  [address]       Ph: [phone]       Fax: [phone]   RxID:   [id number]    Orders Added: 1)  Est. Patient Level IV [27253] 2)  Sports Medicine [Sports Med]     Prevention & Chronic Care Immunizations   Influenza vaccine: Not documented    Tetanus booster: Not documented    Pneumococcal vaccine: Not documented  Other Screening   Pap smear: Normal  (07/28/2004)   Pap smear action/deferral: Deferred  (04/15/2009)    Mammogram: ASSESSMENT: Negative - BI-RADS 1^MS DIGITAL SCREENING  (05/07/2009)   Mammogram action/deferral: Ordered  (04/15/2009)   Mammogram due: 08/2005   Smoking status: never  (02/11/2010)  Lipids   Total Cholesterol: 161  (02/01/2008)   LDL: 99  (02/01/2008)   LDL Direct: Not documented   HDL: 33  (02/01/2008)   Triglycerides: 143  (02/01/2008)      Resource handout printed.  Appended Document: ACUTE-KNEE PAIN F/U/CFB([facility])/CFB patient could not afford lamisal or itraconazole. health department do not have any other on formulary.  will holdof on treatment at this time   Clinical Lists Changes       Appended Document: ACUTE-KNEE PAIN F/U/CFB([facility])/CFB Pt can not afford itraconazole, $2,000.  Dr Scot Dock informed and said there is not another medicine for this condition. Pt will f/u with her PCP

## 2010-02-21 ENCOUNTER — Emergency Department (HOSPITAL_COMMUNITY)
Admission: EM | Admit: 2010-02-21 | Discharge: 2010-02-22 | Disposition: A | Discharge status: Home / Self Care | Payer: Self-pay | Attending: Emergency Medicine | Admitting: Emergency Medicine

## 2010-02-21 DIAGNOSIS — R109 Unspecified abdominal pain: Secondary | ICD-10-CM | POA: Insufficient documentation

## 2010-02-21 DIAGNOSIS — N838 Other noninflammatory disorders of ovary, fallopian tube and broad ligament: Secondary | ICD-10-CM | POA: Insufficient documentation

## 2010-02-21 DIAGNOSIS — D259 Leiomyoma of uterus, unspecified: Secondary | ICD-10-CM | POA: Insufficient documentation

## 2010-02-21 DIAGNOSIS — K219 Gastro-esophageal reflux disease without esophagitis: Secondary | ICD-10-CM | POA: Insufficient documentation

## 2010-02-21 LAB — URINALYSIS, ROUTINE W REFLEX MICROSCOPIC
Bilirubin Urine: NEGATIVE
Ketones, ur: NEGATIVE mg/dL
Protein, ur: NEGATIVE mg/dL
Urine Glucose, Fasting: NEGATIVE mg/dL

## 2010-02-21 LAB — URINE MICROSCOPIC-ADD ON

## 2010-02-22 ENCOUNTER — Emergency Department (HOSPITAL_COMMUNITY): Payer: Self-pay

## 2010-02-22 LAB — WET PREP, GENITAL
Clue Cells Wet Prep HPF POC: NONE SEEN
Trich, Wet Prep: NONE SEEN

## 2010-03-02 ENCOUNTER — Encounter: Payer: Self-pay | Admitting: Sports Medicine

## 2010-03-02 ENCOUNTER — Ambulatory Visit (INDEPENDENT_AMBULATORY_CARE_PROVIDER_SITE_OTHER): Payer: Self-pay | Admitting: Sports Medicine

## 2010-03-02 DIAGNOSIS — M25579 Pain in unspecified ankle and joints of unspecified foot: Secondary | ICD-10-CM

## 2010-03-02 DIAGNOSIS — M7750 Other enthesopathy of unspecified foot: Secondary | ICD-10-CM

## 2010-03-02 DIAGNOSIS — M79609 Pain in unspecified limb: Secondary | ICD-10-CM

## 2010-03-11 NOTE — Assessment & Plan Note (Signed)
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 * Shrimp 2)  ! * Teriyaki Sauce  Physical Exam  General:  obese and in NAD Msk:  Left ankle ankle shows generalized swelling laterally and extending forward in the sinus tarsi; Loose lateral and stable medial ligaments; squeeze test and kleiger test unremarkable; talar dome seems nontender; no sign of peroneal tendon subluxations; no pain at base of 5th MT. 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 Take 1 tablet by mouth once a day 2)  Itraconazole 100 Mg Caps (Itraconazole) .... 2 tablets daily for 12 weeks  Patient Instructions: 1)  do exercises for ankle 2)  squeeze down on ball 3)  lift up on desk 4)  do  some genral exercises with theraband 5)  try using ankle support for 6 weeks 6)  try using insoles when walking or standing 7)  reck in 6 years   Orders Added: 1)  Sports Insoles [L3510] 2)  Airsport brace [L1906] 3)  New Patient Level II [13086]

## 2010-03-13 ENCOUNTER — Encounter: Payer: Self-pay | Admitting: Advanced Practice Midwife

## 2010-03-13 ENCOUNTER — Other Ambulatory Visit: Payer: Self-pay | Admitting: Advanced Practice Midwife

## 2010-03-13 DIAGNOSIS — N83209 Unspecified ovarian cyst, unspecified side: Secondary | ICD-10-CM

## 2010-03-13 DIAGNOSIS — Z01419 Encounter for gynecological examination (general) (routine) without abnormal findings: Secondary | ICD-10-CM

## 2010-04-01 ENCOUNTER — Ambulatory Visit: Payer: Self-pay | Admitting: Obstetrics & Gynecology

## 2010-04-10 NOTE — Progress Notes (Unsigned)
NAMEBRINNLEY, LACAP             ACCOUNT NO.:  [id number]  MEDICAL RECORD NO.:  [id number]           PATIENT TYPE:  A  LOCATION:  [facility]                   FACILITY:  [facility]  PHYSICIAN:  Wynelle Bourgeois, CNM    DATE OF BIRTH:  December 27, 1965  DATE OF SERVICE:  03/13/2010                                 CLINIC NOTE  This is a 45 year old gravida 3, para 2-0-1-2 who presents for evaluation of an ovarian cyst.  She also needs an annual exam.  She was seen in the emergency room for some pelvic pain on February 22, 2010, and found to have a complex right ovarian cyst.  She denies any abnormal bleeding or other problems associated with this.  MEDICAL HISTORY:  Remarkable for usual childhood diseases.  She has had all her vaccinations.  Last mammogram was in 2010.  Last dental exam was also in 2010.  OB/GYN HISTORY:  Remarkable for LMP of March 05, 2010.  Age of menses onset was 66-14.  Periods are irregular, lasting 2-3 days secondary to Mirena IUD.  OB HISTORY:  Remarkable for 2 C-sections and 1 miscarriage.  Last delivery was 4 years ago.  She does have a history of an abnormal Pap in 1988 that she thinks might have been treated with cryosurgery, but she does not remember.  She also has a history of uterine fibroids.  SURGICAL HISTORY:  Remarkable for 2 C-sections in 1986 and 2008 and carpal tunnel surgery on her left and right hands in 2005 and 2009.  FAMILY HISTORY:  Remarkable for diabetes, heart disease, hypertension, blood clots, and kidney failure.  SOCIAL HISTORY:  The patient works as a Surveyor, mining and a Lawyer. She lives with her son.  She denies any alcohol, tobacco, or drug use.  REVIEW OF SYSTEMS:  Remarkable for episodes of headache and cough and leg swelling as well as right lower quadrant pain and she also has a new history of itching on her mons pubis.  ALLERGIES:  None.  OBJECTIVE DATA:  VITAL SIGNS:  98.4, pulse 76, blood pressure 118/77, weight  318.8. HEENT:  Within normal limits. NECK:  Thyroid normal, not enlarged. CHEST:  Clear to auscultation. HEART:  Heart rate regular rate and rhythm. BREASTS:  Soft, nontender.  No masses. ABDOMEN:  Soft, nontender.  No masses. GU:  EG/BUS within normal limits.  There is some excoriation and thickening of the skin on the mons pubis, but no obvious lesions or rash.  Labia are normal.  Vagina is clear and well rugated with no lesions or discharge.  Cervix is closed, multiparous.  Uterus and ovaries are difficult to palpate secondary to habitus.  There is some pain on the right lower quadrant.  Ultrasound from February 22, 2010 shows enlarged uterus, 9.9 x 5.3 x 6.6 cm with multiple fibroids and a Nabothian cyst on her cervix.  IUD is seen.  Endometrium is 0.57 cm. Right ovary is enlarged at 7 x 2.5 x 4.1 cm with solid and cystic components and the right ovary with a solid mass 3.2 x 2.3 x 3.7 cm. Left ovary is normal and there is no free fluid.  ASSESSMENT:  1. Normal annual exam. 2. Complex right ovarian cyst. 3. Uterine fibroids. 4. Dermatitis of mons pubis.  PLAN: 1. Pap sent. 2. Consult with Dr. Jolayne Panther.  We will bring her back in 1-2 weeks for     a surgical consult with one of the physicians to discuss cystectomy     of the right ovarian cyst. 3. Rx Topicort cream b.i.d. to itching area and we will see how she is     doing with that when she returns.          ______________________________ Wynelle Bourgeois, CNM    MW/MEDQ  D:  03/13/2010  T:  03/14/2010  Job:  [id number]

## 2010-04-15 ENCOUNTER — Ambulatory Visit: Payer: Self-pay | Admitting: Obstetrics & Gynecology

## 2010-04-21 ENCOUNTER — Ambulatory Visit: Payer: Self-pay | Admitting: Internal Medicine

## 2010-04-26 LAB — DIFFERENTIAL
Basophils Absolute: 0 10*3/uL (ref 0.0–0.1)
Basophils Relative: 0 % (ref 0–1)
Eosinophils Relative: 1 % (ref 0–5)
Monocytes Absolute: 0.5 10*3/uL (ref 0.1–1.0)

## 2010-04-26 LAB — URINE MICROSCOPIC-ADD ON

## 2010-04-26 LAB — URINALYSIS, ROUTINE W REFLEX MICROSCOPIC
Glucose, UA: NEGATIVE mg/dL
Ketones, ur: NEGATIVE mg/dL
Leukocytes, UA: NEGATIVE
pH: 6 (ref 5.0–8.0)

## 2010-04-26 LAB — POCT CARDIAC MARKERS: Troponin i, poc: <0.05 ng/mL (ref 0.00–0.09)

## 2010-04-26 LAB — CBC
HCT: 39.6 % (ref 36.0–46.0)
Hemoglobin: 13.4 g/dL (ref 12.0–15.0)
MCHC: 33.7 g/dL (ref 30.0–36.0)
MCV: 87 fL (ref 78.0–100.0)
RDW: 13.6 % (ref 11.5–15.5)

## 2010-04-26 LAB — PREGNANCY, URINE: Preg Test, Ur: NEGATIVE

## 2010-04-26 LAB — POCT I-STAT, CHEM 8
BUN: 9 mg/dL (ref 6–23)
Calcium, Ion: 1.03 mmol/L — ABNORMAL LOW (ref 1.12–1.32)
TCO2: 23 mmol/L (ref 0–100)

## 2010-05-15 ENCOUNTER — Ambulatory Visit: Payer: Self-pay | Admitting: Obstetrics and Gynecology

## 2010-05-15 ENCOUNTER — Other Ambulatory Visit: Payer: Self-pay | Admitting: Obstetrics and Gynecology

## 2010-05-15 DIAGNOSIS — Z09 Encounter for follow-up examination after completed treatment for conditions other than malignant neoplasm: Secondary | ICD-10-CM

## 2010-05-15 DIAGNOSIS — N83209 Unspecified ovarian cyst, unspecified side: Secondary | ICD-10-CM

## 2010-05-15 DIAGNOSIS — N83201 Unspecified ovarian cyst, right side: Secondary | ICD-10-CM

## 2010-05-16 NOTE — Group Therapy Note (Signed)
[name], [name]             ACCOUNT NO.:  [id number]  MEDICAL RECORD NO.:  [id number]           PATIENT TYPE:  A  LOCATION:  [facility]                   FACILITY:  [facility]  PHYSICIAN:  Catalina Antigua, MD     DATE OF BIRTH:  26-Nov-1965  DATE OF SERVICE:  05/15/2010                                 CLINIC NOTE  This is a 45 year old G3, P2-0-1-2 who presents today stating that if she needed surgery for removal of fibroids.  The patient is currently without any complaints and denies abnormal bleeding or pain or pressure. Upon reviewing her chart, the patient is actually referred here for surgical consultation for complex ovarian cyst of her right ovary.  The patient stated that she presented to the emergency room on February 22, 2010, secondary to pelvic pain and was found to have a right ovarian mass for which GYN followup was recommended.  The patient is currently using Mirena IUD for management of dysfunctional uterine bleeding and as mentioned before is without any complaints.  The patient states that she has not had any more episode of pelvic pain since one-time in February. It is a little bit surprise to hear that she has an ovarian cyst.  She thought that all she had was a fibroid.  Review of the ultrasound which was performed on February 22, 2010, showed a uterus measuring 9 x 5 x 6 cm with multiple fibroids.  She had an IUD in place and an atrial lining of 0.57 cm.  She had an enlarged right ovary measuring 7 x 2 x 4 cm with a solid cystic component and solid mass appeared to be 3 x 2 x 3 cm. She had a normal-appearing left ovary.  The patient was counseled today for surgical removal of the solid right ovarian mass.  The patient was very reluctant regarding surgery.  The patient was then offered a repeat ultrasound as to follow up this ovarian mass which the patient is more willing to do at this time.  The patient understands that surgery may be necessary if the mass is  persistent or enlarged.  Risks, benefits, and alternatives were explained.  The patient verbalized understanding.  All questions were answered.  The patient will return following ultrasound to discuss results of the ultrasound and surgical management.          ______________________________ Catalina Antigua, MD    PC/MEDQ  D:  05/15/2010  T:  05/16/2010  Job:  [id number]

## 2010-05-22 ENCOUNTER — Ambulatory Visit (HOSPITAL_COMMUNITY): Payer: Self-pay

## 2010-05-25 ENCOUNTER — Ambulatory Visit (HOSPITAL_COMMUNITY): Payer: Self-pay

## 2010-06-02 NOTE — Consult Note (Signed)
NAMEFELISSA, BLOUCH             ACCOUNT NO.:  [id number]   MEDICAL RECORD NO.:  [id number]          PATIENT TYPE:  OBV   LOCATION:  1862                         FACILITY:  [facility]   PHYSICIAN:  Francisca December, M.D.  DATE OF BIRTH:  1965-11-01   DATE OF CONSULTATION:  07/31/2008  DATE OF DISCHARGE:  07/31/2008                                 CONSULTATION   REQUESTING PHYSICIAN:  Tinnie Gens P. Weldon Inches, MD, [facility].   REASON FOR CONSULTATION:  Chest pain.   HISTORY OF PRESENT ILLNESS:  Yarielys Beed is a 45 year old woman with  no known CAD who presented approximately 24 hours ago to [facility] with left parasternal chest pain, which was described as a  tightness, radiating to the left side of her chest and beneath the left  breast where became more sharp in nature.  There was also some  subscapular pain associated with this, onset was spontaneous.  She  cannot say for certain whether exercise or exertion has worsened it.  Over the past 24 hours, it has predominantly resolved.  She has no  pleuritic component.  She feels that she was seen in emergency room  about 2 months ago with similar symptoms and discharged after  observation.  She denies any cough or hemoptysis.  No nausea, vomiting,  or diaphoresis.   PAST MEDICAL HISTORY:  Unremarkable.  She has had C-section x2.  She  denies any history of high blood pressure or diabetes.   FAMILY HISTORY:  Significant for early presumed coronary death in her  sister aged 75.  She had been complaining of chest pain prior and then  subsequently died suddenly.   CURRENT MEDICATIONS:  None.   DRUG ALLERGIES:  None.   SOCIAL HISTORY:  She lives here in [place] and is single, has 2  children, one aged 2 and another aged 45.  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   HABITS:  No alcohol or tobacco use.  She drinks a lot of tea, coffee,  and  sodas during the day.   REVIEW OF SYSTEMS:  Negative except as mentioned above.   PHYSICAL EXAMINATION:  VITAL SIGNS:  Blood pressure is 107/55, pulse is  62 and regular, respiratory rate 18, temperature 98.7, and O2 saturation  on room air 100%.  GENERAL:  This is a morbidly obese, otherwise well-appearing, pleasant,  African American woman in no distress.  HEENT:  Unremarkable.  The head  is atraumatic and normocephalic.  The pupils equal and react to light.  The sclerae are anicteric.  The oral mucosa is pink and moist.  Teeth  and gums in good repair.  Tongue is not coated.  NECK:  Supple without thyromegaly or masses.  The carotid upstrokes are  normal.  There is no bruit.  There is no JVD.  CHEST:  Clear with  adequate excursion.  No wheezes, rales, or rhonchi.  The chest wall is  nontender.  HEART:  Regular rhythm.  Normal S1 and S2 is heard.  No bowel S3, S4,  murmur, click, or rub.  ABDOMEN:  Obese, soft, nontender without organomegaly.  EXTERNAL GENITALIA:  Not examined.  Rectal is not performed.  EXTREMITIES:  Full range of motion.  No edema and intact distal pulses.  NEUROLOGIC:  Cranial nerves II through XII are intact.  Motor and  sensory grossly intact.  Gait not tested.  SKIN:  Warm, dry and clear.  PSYCHIATRIC:  Mood and affect appear appropriate.   Electrocardiogram:  Normal sinus rhythm.  Normal ECG.   Chest x-ray:  No active cardiopulmonary disease.   Serum electrolytes, BUN, creatinine, glucose all within normal limits.  Point-of-care troponin, CK-MB, and myoglobins have been negative x2.   ASSESSMENT:  1. Noncardiac chest pain, low risk for coronary artery disease.  2. Morbid obesity.  3. Myocardial infarction, ruled out.   PLAN:  The patient is discharge to home on no current medications.  She  will return to the office for exercise myocardial perfusion scintigraphy  tomorrow with instructions to call the office or return to the emergency  room if his  chest pain worsens.      Francisca December, M.D.  Electronically Signed     JHE/MEDQ  D:  07/31/2008  T:  08/01/2008  Job:  [id number]   cc:   Otilio Connors. Gerri Spore, M.D.

## 2010-06-05 NOTE — Op Note (Signed)
NAMESHARIE, [name]             ACCOUNT NO.:  [id number]   MEDICAL RECORD NO.:  [id number]          PATIENT TYPE:  AMB   LOCATION:  ENDO                         FACILITY:  [facility]   PHYSICIAN:  Graylin Shiver, M.D.   DATE OF BIRTH:  [birth year]   DATE OF PROCEDURE:  01/05/2005  DATE OF DISCHARGE:  01/05/2005                                 OPERATIVE REPORT   PROCEDURE:  Colonoscopy to hepatic flexure region.   INDICATIONS FOR PROCEDURE:  Chronic abdominal pain, etiology unclear. There  is a report on the patient's records that she had heme-positive stool.   Informed consent was obtained after explanation of the risks of bleeding,  infection and perforation.   PREMEDICATION:  The procedure was done after an EGD with an additional 50  mcg of fentanyl and 5 mg of Versed given.   DESCRIPTION OF PROCEDURE:  With the patient in the left lateral decubitus  position, a rectal exam was performed, no masses were felt. The Olympus  colonoscope was inserted into the rectum and advanced into the sigmoid  colon. The sigmoid was very tortuous. There were a few diverticula noted in  the sigmoid region. The scope was advanced around a very tortuous colon to  the area of the hepatic flexure. I could not advance the scope beyond this  point. I placed the patient on her back, placed the patient on her right  side, applied pressure to various points in the abdomen but despite all  maneuvers could not advance the scope beyond the region of the hepatic  flexure. The scope was brought out, no lesions were seen in the transverse  colon. Only diverticula were seen in the left colon. The rectum was normal.  She tolerated the procedure well without complications.   IMPRESSION:  Left-sided diverticulosis.   PLAN:  Proceed with barium enema to evaluate the more proximal colon.   I see nothing specifically on this exam to explain the patient's complaints  of chronic abdominal pain.     ______________________________  Graylin Shiver, M.D.     SFG/MEDQ  D:  01/05/2005  T:  01/07/2005  Job:  [id number]   cc:   Inis Sizer, M.D.

## 2010-06-05 NOTE — Op Note (Signed)
NAMEJASEMINE, NAWAZ             ACCOUNT NO.:  [id number]   MEDICAL RECORD NO.:  [id number]          PATIENT TYPE:  AMB   LOCATION:  ENDO                         FACILITY:  [facility]   PHYSICIAN:  Graylin Shiver, M.D.   DATE OF BIRTH:  03-24-1965   DATE OF PROCEDURE:  01/05/2005  DATE OF DISCHARGE:  01/05/2005                                 OPERATIVE REPORT   PROCEDURE:  Esophagogastroduodenoscopy.   INDICATIONS FOR PROCEDURE:  Chronic abdominal pain, etiology unclear.   Informed consent was obtained after explanation of the risks of bleeding,  infection and perforation.   PREMEDICATION:  Fentanyl 50 mcg IV, Versed 5 mg IV.   DESCRIPTION OF PROCEDURE:  With the patient in the left lateral decubitus  position, the Olympus gastroscope was inserted into the oropharynx and  passed into the esophagus. It was advanced down the esophagus and then into  the stomach and into the duodenum. The second portion and bulb of the  duodenum looked normal. The stomach looked normal in its entirety. The  esophagus looked normal in its entirety. The esophagogastric junction was at  40 cm. She tolerated the procedure well without complications.   IMPRESSION:  Normal esophagogastroduodenoscopy.   There is nothing on this exam to explain the patient's chronic abdominal  pain.           ______________________________  Graylin Shiver, M.D.     SFG/MEDQ  D:  01/05/2005  T:  01/07/2005  Job:  [id number]   cc:   Inis Sizer, M.D.

## 2010-06-05 NOTE — Discharge Summary (Signed)
Janet [name], Janet [name]             ACCOUNT NO.:  [id number]   MEDICAL RECORD NO.:  [id number]          PATIENT TYPE:  INP   LOCATION:  9141                          [facility]ACILITY[facility]   PHYSICIAN:  Phil D. Okey Dupre, M.D.     DATE OF BIRTH:  1965/07/29   DATE OF ADMISSION:  02/28/2006  DATE OF DISCHARGE:  03/05/2006                               DISCHARGE SUMMARY   ADMISSION DIAGNOSES:  1. Intrauterine pregnancy at term.  2. History of prior cesarean section.  3. Induction of labor secondary to post dates.  4. Advanced maternal age.   DISCHARGE DIAGNOSES:  1. Term intrauterine pregnancy delivered via repeat low transverse      cesarean section.  2. History of prior cesarean section.  3. Failed induction.  4. Advanced maternal age.   PERTINENT LABORATORIES:  Prenatal labs included blood type of B  positive, antibody negative, rubella immune, hepatitis B surface antigen  negative, RPR negative, HIV negative, GBS positive, 1-hour GTT 75.  Preoperative hemoglobin 12.6.  Postoperative hemoglobin 9.5.   REASON FOR ADMISSION:  This is a 45 year old gravida 3, para 1-0-1-1 at  51 and two-sevenths weeks gestation by LMP that was consistent with an  18-week ultrasound who has a history of a prior cesarean section greater  than 20 years ago, who presented for induction of labor secondary for  post dates.  At the time of admission the patient was offered a repeat  cesarean section.  The patient and partner desires to have an natural  childbirth.   HOSPITAL COURSE:  The patient was admitted for induction of labor with  an unfavorable cervix.  Given the history of prior C-section she was  started on a low-dose Pitocin protocol which was maintained for greater  than 24 hours with no cervical change.  The patient has had a history of  cryotherapy done to the cervix many years ago and the cervix was thought  to be scarred from this procedure.  The patient was again given the  option for a repeat  cesarean section but both the patient and partner  wanted to proceed with a vaginal delivery.  An attempt was done to break  the scar tissue.  After this procedure the cervix was open to a loose 1  cm, was 80% effaced and baby was at -1 station.  The Pitocin was  continued and the patient still with no cervical change after 12 hours.  At this point the patient had a failed induction of labor x2 days.  Cervix remained unchanged at 1 cm.  After discussion with the patient  and the partner, they agreed to proceed with a repeat cesarean section.  The risks and benefits of the procedure were discussed with the patient  and she was taken for repeat C-section.  Please see the operative note  for complete details of the surgery.  The patient had a viable female  infant with Apgars of 9 and 9 at one and five minutes respectively.  Meconium was noted.  Her postoperative course was unremarkable and she  was discharged home on postoperative day #3 in  hemodynamically stable  condition.   DISCHARGE MEDICATIONS:  1. Ibuprofen 600 mg take one every 6 hours p.r.n. pain.  2. Percocet 5/325 mg take one every 6 hours p.r.n. pain.  3. Ferrous sulfate 325 mg take one p.o. b.i.d.  4. Colace 100 mg take one p.o. b.i.d.   DISCHARGE INSTRUCTIONS:  Included pelvic rest x6 weeks.  [facility].  She is to have  an IUD placed at 6 weeks postpartum, will be using condoms until that  visit.     ______________________________  Paticia Stack, MD    ______________________________  Javier Glazier. Okey Dupre, M.D.    LNJ/MEDQ  D:  03/08/2006  T:  03/08/2006  Job:  [id number]

## 2010-06-05 NOTE — Op Note (Signed)
Janet [name], Janet [name]          ACCOUNT NO.:  [id number]   MEDICAL RECORD NO.:  [id number]          PATIENT TYPE:  INP   LOCATION:  9141                          [facility]ACILITY:  [facility]   PHYSICIAN:  Tracy L. Mayford Knife, M.D.DATE OF BIRTH:  10/04/1965   DATE OF PROCEDURE:  03/02/2006  DATE OF DISCHARGE:                               OPERATIVE REPORT   PREOPERATIVE DIAGNOSIS:  1. Intrauterine pregnancy 41+ weeks.  2. Failed trial of labor after cesarean section.   POSTOPERATIVE DIAGNOSIS:  1. Intrauterine pregnancy 41+ weeks.  2. Failed trial of labor after cesarean section.  3. Meconium. and into   PROCEDURE:  Repeat low transverse cesarean section via Pfannenstiel.   SURGEON:  Lesly Dukes, M.D.   ASSISTANT:  Carolanne Grumbling, M.D.  Paticia Stack, M.D.   ANESTHESIA:  Epidural.   COMPLICATIONS:  None.   ESTIMATED BLOOD LOSS:  700 mL.   URINE OUTPUT:  Clear.   INDICATIONS:  A 45 year old G3, P 1-0-1-1 at 41+ weeks gestational age,  presented for induction for being post dates.  She desire trial of labor  after cesarean section.  She never progressed past 1-2 cm.  She did  efface to approximately 90%.  Her cervix was felt to be scarred from a  previous cryo therapy.  So scar tissue was attempted to be  broken down  but ultimately did not result in her dilating.   FINDINGS:  Female infant in cephalic presentation.  Meconium.  NICU  present at delivery.  Apgars 9 and 9.  Normal uterus, tubes and ovaries.  Weight 7 pounds 9 ounces.   DESCRIPTION OF PROCEDURE:  The patient was taken to the operating room  where epidural anesthesia was found to be adequate.  She was prepped and  draped in normal sterile fashion in dorsal supine position with a  leftward tilt.  A Pfannenstiel skin incision was made with a scalpel and  carried through to the underlying layer of fascia.  Fascia was nicked in  midline and incision extended laterally with the Mayo scissors.  Superior aspect of the  fascial incision was grasped with Kocher clamps,  elevated.  Underlying rectus muscles dissected off bluntly.  Attention  was then turned to the inferior aspect of the incision which in similar  fashion was grasped, tented up with Kocher clamps. The rectus muscle  dissected off bluntly and using the Mayos, peritoneal incision was  extended superiorly and inferiorly with good visualization of the  bladder.  The incision was still felt to be too small so the rectus  muscles were transversed using the Bovie.   Bladder blade was inserted and vesicouterine perineum identified,  grasped with pickups and entered sharply with Metzenbaum scissors.  Incision was extended laterally and the bladder flap created digitally.  Bladder blade was then reinserted in the lower uterine segment, incised  in transverse fashion with a scalpel.  Uterine incision was extended  laterally with the bandage scissors.  Bladder blade was removed.  The  infant's head and body delivered atraumatically.  Nose and mouth were  bulb suctioned.  Infant was handed off to waiting neonatologist.  Cord  blood was sent.  pH was 7.23.   Placenta was removed manually and the uterus was cleared of all clots  and debris.  Placenta was sent to pathology.  Uterine incision was  repaired with 0 Vicryl in a running locked fashion.  There was some  oozing from the right side that was suspicious for a being the site of a  degenerating fibroid.  It was cauterized and chromic was used to obtain  excellent hemostasis.  Fascia was closed with 0 Vicryl in a running  fashion.  Skin was closed with staples.  The patient tolerated the  procedure well.  Sponge, lap and needle counts were correct x2.  Ancef,  2 grams, was given at cord clamp.  The patient was taken to the recovery  room in stable condition.  There were no complications.           ______________________________  Marc Morgans Mayford Knife, M.D.     TLW/MEDQ  D:  03/02/2006  T:   03/02/2006  Job:  [id number]

## 2010-06-05 NOTE — Op Note (Signed)
   NAMENATALIA, Janet [name]                         ACCOUNT NO.:  [id number]   MEDICAL RECORD NO.:  [id number]                   PATIENT TYPE:  OUT   LOCATION:  XRAY                                 [facility]   PHYSICIAN:  Phil D. Okey Dupre, M.D.                  DATE OF BIRTH:  03-Jul-1965   DATE OF PROCEDURE:  09/25/2001  DATE OF DISCHARGE:  09/21/2001                                 OPERATIVE REPORT   PREOPERATIVE DIAGNOSIS:  Missed abortion.   POSTOPERATIVE DIAGNOSIS:  Missed abortion.   PROCEDURE:  Dilatation and evacuation.   SURGEON:  Javier Glazier. Rose, M.D.   DESCRIPTION OF PROCEDURE:  Under satisfactory MAC with the patient in the  dorsal lithotomy position, the paracervical area was injected at 4 and 8  o'clock with 10 cc of 1% Xylocaine in each area.  The anterior lip of the  cervix was grasped with single-tooth tenaculum, and the uterine cavity  sounded to a depth of 14 cm.  The cervical os was dilated to a #8 Hager  dilator and the uterine cavity evacuated with the suction curet #8 with  ease.  There was minimal bleeding during the procedure, and the was  transferred to the recovery room in satisfactory condition.                                               Phil D. Okey Dupre, M.D.    PDR/MEDQ  D:  09/25/2001  T:  09/25/2001  Job:  [id number]

## 2010-06-19 ENCOUNTER — Encounter: Payer: Self-pay | Admitting: Internal Medicine

## 2010-07-03 ENCOUNTER — Ambulatory Visit: Payer: Self-pay | Admitting: Internal Medicine

## 2010-08-04 ENCOUNTER — Ambulatory Visit (INDEPENDENT_AMBULATORY_CARE_PROVIDER_SITE_OTHER): Payer: Self-pay | Admitting: *Deleted

## 2010-08-04 DIAGNOSIS — Z111 Encounter for screening for respiratory tuberculosis: Secondary | ICD-10-CM

## 2010-08-07 LAB — TB SKIN TEST: Induration: 0

## 2010-09-21 ENCOUNTER — Emergency Department (HOSPITAL_COMMUNITY)
Admission: EM | Admit: 2010-09-21 | Discharge: 2010-09-21 | Disposition: A | Discharge status: Home / Self Care | Payer: Self-pay | Attending: Emergency Medicine | Admitting: Emergency Medicine

## 2010-09-21 DIAGNOSIS — R609 Edema, unspecified: Secondary | ICD-10-CM | POA: Insufficient documentation

## 2010-09-21 DIAGNOSIS — M7989 Other specified soft tissue disorders: Secondary | ICD-10-CM | POA: Insufficient documentation

## 2010-09-21 LAB — URINE MICROSCOPIC-ADD ON

## 2010-09-21 LAB — BASIC METABOLIC PANEL
BUN: 7 mg/dL (ref 6–23)
Calcium: 9.1 mg/dL (ref 8.4–10.5)
Creatinine, Ser: 0.92 mg/dL (ref 0.50–1.10)
GFR calc Af Amer: >60 mL/min (ref >60)

## 2010-09-21 LAB — URINALYSIS, ROUTINE W REFLEX MICROSCOPIC
Leukocytes, UA: NEGATIVE
Nitrite: NEGATIVE
Specific Gravity, Urine: 1.005 (ref 1.005–1.030)
Urobilinogen, UA: 0.2 mg/dL (ref 0.0–1.0)

## 2010-09-21 LAB — POCT PREGNANCY, URINE: Preg Test, Ur: NEGATIVE

## 2010-12-14 ENCOUNTER — Encounter (HOSPITAL_COMMUNITY): Payer: Self-pay | Admitting: *Deleted

## 2010-12-14 ENCOUNTER — Emergency Department (HOSPITAL_COMMUNITY)
Admission: EM | Admit: 2010-12-14 | Discharge: 2010-12-15 | Disposition: A | Discharge status: Home / Self Care | Payer: Self-pay | Attending: Emergency Medicine | Admitting: Emergency Medicine

## 2010-12-14 DIAGNOSIS — E669 Obesity, unspecified: Secondary | ICD-10-CM | POA: Insufficient documentation

## 2010-12-14 DIAGNOSIS — R05 Cough: Secondary | ICD-10-CM | POA: Insufficient documentation

## 2010-12-14 DIAGNOSIS — R51 Headache: Secondary | ICD-10-CM | POA: Insufficient documentation

## 2010-12-14 DIAGNOSIS — K219 Gastro-esophageal reflux disease without esophagitis: Secondary | ICD-10-CM | POA: Insufficient documentation

## 2010-12-14 DIAGNOSIS — J069 Acute upper respiratory infection, unspecified: Secondary | ICD-10-CM | POA: Insufficient documentation

## 2010-12-14 DIAGNOSIS — J3489 Other specified disorders of nose and nasal sinuses: Secondary | ICD-10-CM | POA: Insufficient documentation

## 2010-12-14 DIAGNOSIS — R059 Cough, unspecified: Secondary | ICD-10-CM | POA: Insufficient documentation

## 2010-12-14 DIAGNOSIS — R509 Fever, unspecified: Secondary | ICD-10-CM | POA: Insufficient documentation

## 2010-12-14 MED ORDER — ACETAMINOPHEN 325 MG PO TABS
ORAL_TABLET | ORAL | Status: AC
  Filled 2010-12-14: qty 2

## 2010-12-14 MED ORDER — ACETAMINOPHEN 325 MG PO TABS
650.0000 mg | ORAL_TABLET | Freq: Once | ORAL | Status: AC
  Administered 2010-12-14: 650 mg via ORAL

## 2010-12-14 NOTE — ED Notes (Signed)
Headache cold sorethroat her nose is draining and she is having some chest pain with a cough she has an elevated temp.  She had a fever reducer this am 0930.  She worked all day and has not taken any meds

## 2010-12-15 ENCOUNTER — Emergency Department (HOSPITAL_COMMUNITY): Payer: Self-pay

## 2010-12-15 NOTE — ED Provider Notes (Signed)
History    45yf with multiple complaints. Has had cough for past few days. Runny nose. Feels congested. Fever. HA. Sore throat. Generally tired. HA is diffuse. Relatively constant. Gradual onset. No numbness, tingling or loss of strength. No neck pain or stiffness. No rash. No n/v/d.  CSN: [id number] Arrival date & time: 12/14/2010  6:23 PM   First MD Initiated Contact with Patient 12/15/10 0101      Chief Complaint  Patient presents with  . Headache    (Consider location/radiation/quality/duration/timing/severity/associated sxs/prior treatment) HPI  Past Medical History  Diagnosis Date  . Obesity   . Chronic venous insufficiency     2D ECHO EF 55-60%. Nl LFT's, Nl renal fxn. Refused conpression stockings.  . Chronic abdominal pain     Once a month, lasts 3-4 days, could have been related to IUD, now resolved.  Marland Kitchen GERD (gastroesophageal reflux disease)   . Spontaneous abortion 2003    at 3 months gestation  . Heme positive stool     Colonoscopy done, [facility] 12/2004, diverticulosis in sigmoid. EGD 12/2004 normal.  . Ovarian enlargement, left 01/2005    4.7/3 cm on CT    Past Surgical History  Procedure Date  . Cesarean section   . Carpal tunnel release     Family History  Problem Relation Age of Onset  . Kidney disease Mother   . Diabetes Mother   . Heart disease Mother     CHF  . Hypertension Mother   . Rheum arthritis Mother   . Heart disease Father     CHF  . Alcohol abuse Father   . Hypertension Father     History  Substance Use Topics  . Smoking status: Never Smoker   . Smokeless tobacco: Not on file  . Alcohol Use: No    OB History    Grav Para Term Preterm Abortions TAB SAB Ect Mult Living                  Review of Systems   Review of symptoms negative unless otherwise noted in HPI.  Allergies  Shellfish allergy  Home Medications  No current outpatient prescriptions on file.  BP 104/56  Pulse 82  Temp(Src) 98.4 F (36.9 C) (Oral)  Resp  16  SpO2 100%  LMP 11/13/2010  Physical Exam  Nursing note and vitals reviewed. Constitutional: She is oriented to person, place, and time. No distress.       obese  HENT:  Head: Normocephalic and atraumatic.  Right Ear: External ear normal.  Left Ear: External ear normal.  Mouth/Throat: Oropharynx is clear and moist. No oropharyngeal exudate.  Eyes: Conjunctivae are normal. Pupils are equal, round, and reactive to light. Right eye exhibits no discharge. Left eye exhibits no discharge. No scleral icterus.  Neck: Normal range of motion. Neck supple.  Cardiovascular: Normal rate, regular rhythm and normal heart sounds.  Exam reveals no gallop and no friction rub.   No murmur heard. Pulmonary/Chest: Effort normal and breath sounds normal. No stridor. No respiratory distress. She has no wheezes. She has no rales.  Abdominal: Soft. She exhibits no distension. There is no tenderness.  Musculoskeletal: She exhibits no tenderness.  Lymphadenopathy:    She has no cervical adenopathy.  Neurological: She is alert and oriented to person, place, and time. No cranial nerve deficit. She exhibits normal muscle tone. Coordination normal.  Skin: Skin is warm and dry. She is not diaphoretic.  Psychiatric: She has a normal mood and affect. Her  behavior is normal. Thought content normal.    ED Course  Procedures (including critical care time)  Labs Reviewed - No data to display Dg Chest 2 View  12/15/2010  *RADIOLOGY REPORT*  Clinical Data: Cough, sore throat and fever.  CHEST - 2 VIEW  Comparison: 07/30/2008  Findings: Suggestion of mild bronchial thickening without focal infiltrate or edema.  Mild atelectasis present at both lung bases. No pleural effusions.  Heart size is normal.  IMPRESSION: Potential bronchitis. No focal infiltrate identified.  Original Report Authenticated By: Reola Calkins, M.D.     1. Upper respiratory infection   2. Fever       MDM  45yF with fever, congestion and  cough. Suspect uri, likley viral. Pt very well appearing. Lung exam normal and CXR clear. HEENT exam without evidence of deep space infection. Plan symptomatic tx and pcp fu.        Raeford Razor, MD 12/17/10 [phone]

## 2011-03-26 ENCOUNTER — Encounter: Payer: Self-pay | Admitting: Internal Medicine

## 2011-03-30 ENCOUNTER — Encounter: Payer: Self-pay | Admitting: Internal Medicine

## 2011-04-01 ENCOUNTER — Other Ambulatory Visit: Payer: Self-pay | Admitting: Internal Medicine

## 2011-04-01 DIAGNOSIS — Z975 Presence of (intrauterine) contraceptive device: Secondary | ICD-10-CM

## 2011-04-29 ENCOUNTER — Ambulatory Visit: Payer: Self-pay | Admitting: Advanced Practice Midwife

## 2011-06-11 ENCOUNTER — Ambulatory Visit: Payer: Self-pay | Admitting: Obstetrics & Gynecology

## 2011-06-29 ENCOUNTER — Ambulatory Visit (INDEPENDENT_AMBULATORY_CARE_PROVIDER_SITE_OTHER): Payer: Self-pay | Admitting: Internal Medicine

## 2011-06-29 ENCOUNTER — Encounter: Payer: Self-pay | Admitting: Internal Medicine

## 2011-06-29 VITALS — BP 125/79 | HR 68 | Temp 97.1°F | Ht 65.0 in | Wt 322.0 lb

## 2011-06-29 DIAGNOSIS — B351 Tinea unguium: Secondary | ICD-10-CM

## 2011-06-29 DIAGNOSIS — Z111 Encounter for screening for respiratory tuberculosis: Secondary | ICD-10-CM

## 2011-06-29 DIAGNOSIS — E669 Obesity, unspecified: Secondary | ICD-10-CM

## 2011-06-29 DIAGNOSIS — M25579 Pain in unspecified ankle and joints of unspecified foot: Secondary | ICD-10-CM

## 2011-06-29 DIAGNOSIS — S93409A Sprain of unspecified ligament of unspecified ankle, initial encounter: Secondary | ICD-10-CM | POA: Insufficient documentation

## 2011-06-29 DIAGNOSIS — Z00129 Encounter for routine child health examination without abnormal findings: Secondary | ICD-10-CM | POA: Insufficient documentation

## 2011-06-29 MED ORDER — NAPROXEN SODIUM 220 MG PO TABS: 220.0000 mg | ORAL_TABLET | Freq: Two times a day (BID) | ORAL | Status: DC

## 2011-06-29 NOTE — Assessment & Plan Note (Signed)
The patient likely has a sprain/strain of her lateral ankle. I informed her that she should take over-the-counter naproxen sodium 220 mg twice a day with food scheduled for 2 weeks. She can take an additional tablet if she has breakthrough pain. She will also use ice, and elevation to relieve the swelling. If her pain is not relieved in 2 weeks or she has unbearable pain she is to call the clinic and be seen or go to the ED.

## 2011-06-29 NOTE — Progress Notes (Signed)
Subjective:     Patient ID: Janet [name], female   DOB: 1965-03-22, 46 y.o.   MRN: [id number]  Ankle Injury  Incident onset: One month ago the patient was walking off her porch and she missed a step. The incident occurred at home. The injury mechanism was a fall (She does not think that she twisted or landed funny on her ankle.). The pain is present in the right ankle. The quality of the pain is described as aching. The pain is at a severity of 5/10. The pain is mild. The pain has been constant since onset. Pertinent negatives include no inability to bear weight, loss of motion, loss of sensation, muscle weakness, numbness or tingling. Associated symptoms comments: She states she has been able to walk, but this makes the pain worse. She also notes lower extremity swelling that is bilateral that has been going on for some time. No joint specific swelling.. She reports no foreign bodies present. The symptoms are aggravated by weight bearing. She has tried NSAIDs, ice and heat for the symptoms. The treatment provided moderate relief.   She also states she wants her toenail fungus treated.  She states she had gotten a prescription previously and the pharmacy told her it would be $1000.  She is interested in having bariatric surgery performed. She does not particularly exercise and has not been having success with dieting. 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  Review of Systems  Respiratory: Negative for shortness of breath.   Cardiovascular: Positive for leg swelling. Negative for chest pain.  Musculoskeletal: Positive for joint swelling.  Skin:       Complains of toenail fungus  Neurological: Negative for tingling, weakness and numbness.       Objective:   Physical Exam  Constitutional: No distress.       Obese  Cardiovascular: Normal rate, regular rhythm, normal heart sounds and intact distal pulses.   No murmur heard. Pulmonary/Chest: Effort normal and  breath sounds normal. No respiratory distress. She has no wheezes. She has no rales. She exhibits no tenderness.  Musculoskeletal: She exhibits edema.       Right ankle: She exhibits normal range of motion, no swelling, no ecchymosis, no deformity, no laceration and normal pulse. tenderness. Achilles tendon exhibits no pain.       Only very mild tenderness on the lateral anterior aspect of the ankle. She also has very mild bilateral lower leg edema. There are soft, easily compressible varicosities present in the bilateral lower legs.  Neurological: She is alert. No sensory deficit.  Reflex Scores:      Patellar reflexes are 2+ on the right side and 2+ on the left side.      Achilles reflexes are 2+ on the right side and 2+ on the left side. Skin: Skin is warm and dry. No rash noted. She is not diaphoretic.       Dark, friable, thickened toenails underneath the nail polish.       Assessment:      case discussed with Dr. Josem Kaufmann. Please see problem oriented charting for assessment and plan by problem (best viewed under encounters tab).

## 2011-06-29 NOTE — Assessment & Plan Note (Signed)
PPD placed today. Patient will come back in 2 days to have this read.

## 2011-06-29 NOTE — Assessment & Plan Note (Addendum)
I informed the patient that the cost of oral terbinafine will be approximately $20. She will require 12 weeks of therapy. I also informed her about the black box warning regarding systemic antifungals such as terbinafine. I informed her that (mainly in patients with underlying liver disease) there have been cases reported of terbinafine causing fulminant liver failure leading to death. She elected to not pursue systemic therapy due to these risks. The patient will continue using topical antifungals for the occasional tinea pedis that results from her onychomycosis

## 2011-06-29 NOTE — Assessment & Plan Note (Addendum)
I informed the patient that anyone in our clinic considering bariatric surgery is required to visit the class "surgical options for weight loss and ". I gave her the phone number to [facility] [phone] so that she could register for this course if she is interested in bariatric surgery. I also counseled her that bariatric surgery is only effective if it is accompanied by a very significant lifestyle change and she will have to demonstrate her willingness to lose weight and maintain an adequate diet prior to surgery. The patient will followup with her PCP regarding bariatric surgery after she is done this course

## 2011-06-29 NOTE — Patient Instructions (Addendum)
You can take naproxen sodium over-the-counter for your ankle pain. If your pain is not improved in 2 weeks, please call and be seen in our clinic. Please continue to ice your ankle.  We require all patients considering bariatric surgery to attend the following course "surgical options for weight loss." Please call health connect at [phone] to register for this course.  We will schedule you to see our nurse in 48-72 hours to have your PPD read.     Ankle Sprain An ankle sprain is an injury to the strong, fibrous tissues (ligaments) that hold the bones of your ankle joint together.  CAUSES Ankle sprain usually is caused by a fall or by twisting your ankle. People who participate in sports are more prone to these types of injuries.  SYMPTOMS  Symptoms of ankle sprain include:  Pain in your ankle. The pain may be present at rest or only when you are trying to stand or walk.   Swelling.   Bruising. Bruising may develop immediately or within 1 to 2 days after your injury.   Difficulty standing or walking.  DIAGNOSIS  Your caregiver will ask you details about your injury and perform a physical exam of your ankle to determine if you have an ankle sprain. During the physical exam, your caregiver will press and squeeze specific areas of your foot and ankle. Your caregiver will try to move your ankle in certain ways. An X-ray exam may be done to be sure a bone was not broken or a ligament did not separate from one of the bones in your ankle (avulsion).  TREATMENT  Certain types of braces can help stabilize your ankle. Your caregiver can make a recommendation for this. Your caregiver may recommend the use of medication for pain. If your sprain is severe, your caregiver may refer you to a surgeon who helps to restore function to parts of your skeletal system (orthopedist) or a physical therapist. HOME CARE INSTRUCTIONS  Apply ice to your injury for 1 to 2 days or as directed by your caregiver.  Applying ice helps to reduce inflammation and pain.  Put ice in a plastic bag.   Place a towel between your skin and the bag.   Leave the ice on for 15 to 20 minutes at a time, every 2 hours while you are awake.   Take over-the-counter or prescription medicines for pain, discomfort, or fever only as directed by your caregiver.   Keep your injured leg elevated, when possible, to lessen swelling.   If your caregiver recommends crutches, use them as instructed. Gradually, put weight on the affected ankle. Continue to use crutches or a cane until you can walk without feeling pain in your ankle.   If you have a plaster splint, wear the splint as directed by your caregiver. Do not rest it on anything harder than a pillow the first 24 hours. Do not put weight on it. Do not get it wet. You may take it off to take a shower or bath.   You may have been given an elastic bandage to wear around your ankle to provide support. If the elastic bandage is too tight (you have numbness or tingling in your foot or your foot becomes cold and blue), adjust the bandage to make it comfortable.   If you have an air splint, you may blow more air into it or let air out to make it more comfortable. You may take your splint off at night and before taking  a shower or bath.   Wiggle your toes in the splint several times per day if you are able.  SEEK MEDICAL CARE IF:   You have an increase in bruising, swelling, or pain.   Your toes feel cold.   Pain relief is not achieved with medication.  SEEK IMMEDIATE MEDICAL CARE IF: Your toes are numb or blue or you have severe pain. MAKE SURE YOU:   Understand these instructions.   Will watch your condition.   Will get help right away if you are not doing well or get worse.  Document Released: 01/04/2005 Document Revised: 12/24/2010 Document Reviewed: 08/09/2007 [facility] Patient Information 2012 [place], [place].

## 2011-07-01 LAB — TB SKIN TEST
Induration: 0 mm
TB Skin Test: NEGATIVE

## 2011-07-05 ENCOUNTER — Ambulatory Visit (INDEPENDENT_AMBULATORY_CARE_PROVIDER_SITE_OTHER): Payer: Self-pay | Admitting: *Deleted

## 2011-07-05 ENCOUNTER — Encounter: Payer: Self-pay | Admitting: Obstetrics and Gynecology

## 2011-07-05 VITALS — BP 123/84 | HR 79 | Temp 98.1°F | Ht 65.5 in | Wt 315.5 lb

## 2011-07-05 DIAGNOSIS — Z309 Encounter for contraceptive management, unspecified: Secondary | ICD-10-CM

## 2011-07-05 NOTE — Progress Notes (Signed)
Patient came in today for IUD removal. Patient was not aware that removal cost $240 and decided [facility] because she doesn't have to pay there. Did not see provider at this visit

## 2011-07-15 ENCOUNTER — Telehealth: Payer: Self-pay | Admitting: *Deleted

## 2011-07-15 NOTE — Telephone Encounter (Signed)
Pt c/o  White patches to throat, chills and fever ( has not checked) . Onset yesterday.  Taking tylenol sinus with some relief.   Drinking but not eating much as it's hard to swollow.  Appointment tomorrow as pt can not come in today.

## 2011-07-16 ENCOUNTER — Ambulatory Visit (INDEPENDENT_AMBULATORY_CARE_PROVIDER_SITE_OTHER): Payer: Self-pay | Admitting: Internal Medicine

## 2011-07-16 ENCOUNTER — Encounter: Payer: Self-pay | Admitting: Internal Medicine

## 2011-07-16 VITALS — BP 116/74 | HR 107 | Temp 101.2°F | Ht 65.0 in | Wt 311.7 lb

## 2011-07-16 DIAGNOSIS — J209 Acute bronchitis, unspecified: Secondary | ICD-10-CM

## 2011-07-16 MED ORDER — DEXTROMETHORPHAN-GUAIFENESIN 10-100 MG/5ML PO LIQD: 5.0000 mL | ORAL | Status: AC | PRN

## 2011-07-16 MED ORDER — DOXYCYCLINE HYCLATE 100 MG PO TABS: 100.0000 mg | ORAL_TABLET | Freq: Two times a day (BID) | ORAL | Status: AC

## 2011-07-16 NOTE — Assessment & Plan Note (Signed)
Cough with fevers and productive cough makes bronchitis likely diagnosis at this time. I would treat with antibiotics (doxycycline) for 7 days along with steam inhalation, gargles with salt water, cough suppresant and instructions to return to [facility] if symptoms are worse. Patient was also advised to drink plenty of fluids.

## 2011-07-16 NOTE — Patient Instructions (Signed)
Bronchitis Bronchitis is the body's way of reacting to injury and/or infection (inflammation) of the bronchi. Bronchi are the air tubes that extend from the windpipe into the lungs. If the inflammation becomes severe, it may cause shortness of breath. CAUSES  Inflammation may be caused by:  A virus.   Germs (bacteria).   Dust.   Allergens.   Pollutants and many other irritants.  The cells lining the bronchial tree are covered with tiny hairs (cilia). These constantly beat upward, away from the lungs, toward the mouth. This keeps the lungs free of pollutants. When these cells become too irritated and are unable to do their job, mucus begins to develop. This causes the characteristic cough of bronchitis. The cough clears the lungs when the cilia are unable to do their job. Without either of these protective mechanisms, the mucus would settle in the lungs. Then you would develop pneumonia. Smoking is a common cause of bronchitis and can contribute to pneumonia. Stopping this habit is the single most important thing you can do to help yourself. TREATMENT   Your caregiver may prescribe an antibiotic if the cough is caused by bacteria. Also, medicines that open up your airways make it easier to breathe. Your caregiver may also recommend or prescribe an expectorant. It will loosen the mucus to be coughed up. Only take over-the-counter or prescription medicines for pain, discomfort, or fever as directed by your caregiver.   Removing whatever causes the problem (smoking, for example) is critical to preventing the problem from getting worse.   Cough suppressants may be prescribed for relief of cough symptoms.   Inhaled medicines may be prescribed to help with symptoms now and to help prevent problems from returning.   For those with recurrent (chronic) bronchitis, there may be a need for steroid medicines.  SEEK IMMEDIATE MEDICAL CARE IF:   During treatment, you develop more pus-like mucus  (purulent sputum).   You have a fever.  You become progressively more ill.   You have increased difficulty breathing, wheezing, or shortness of breath.  It is necessary to seek immediate medical care if you are elderly or sick from any other disease. MAKE SURE YOU:   Understand these instructions.   Will watch your condition.   Will get help right away if you are not doing well or get worse.  Document Released: 01/04/2005 Document Revised: 12/24/2010 Document Reviewed: 11/14/2007 [facility] Patient Information 2012 [place], [place].

## 2011-07-16 NOTE — Progress Notes (Signed)
  Subjective:    Patient ID: Janet [name], female    DOB: 1965/06/17, 46 y.o.   MRN: [id number]  HPI Patient is a 46 year old morbidly obese woman with past medical history most significant for onychomycosis.   This is an acute visit for sore throat. Said that she started having cough, pain in her throat, fevers and difficulty swallowing 2 days ago. Patient is bringing up yellow colored sputum. No shortness of breath noted. Patient reports a sick contact that is her neighbor.   Review of Systems  All other systems reviewed and are negative.       Objective:   Physical Exam  Constitutional: She is oriented to person, place, and time. She appears well-developed and well-nourished.  HENT:  Head: Normocephalic and atraumatic.  Mouth/Throat: Oropharyngeal exudate present.       Patient had oropharyngeal exudates present with redness of uvula.  Eyes: Conjunctivae and EOM are normal. Pupils are equal, round, and reactive to light. No scleral icterus.  Neck: Normal range of motion. Neck supple. No JVD present. No thyromegaly present.  Cardiovascular: Normal rate, regular rhythm, normal heart sounds and intact distal pulses.  Exam reveals no gallop and no friction rub.   No murmur heard. Pulmonary/Chest: Effort normal and breath sounds normal. No respiratory distress. She has no wheezes. She has no rales.  Abdominal: Soft. Bowel sounds are normal. She exhibits no distension and no mass. There is no tenderness. There is no rebound and no guarding.  Musculoskeletal: Normal range of motion. She exhibits no edema and no tenderness.  Lymphadenopathy:    She has no cervical adenopathy.  Neurological: She is alert and oriented to person, place, and time.  Psychiatric: She has a normal mood and affect. Her behavior is normal.          Assessment & Plan:

## 2011-09-02 ENCOUNTER — Encounter: Payer: Self-pay | Admitting: Obstetrics & Gynecology

## 2011-10-13 ENCOUNTER — Other Ambulatory Visit (HOSPITAL_COMMUNITY): Payer: Self-pay | Admitting: Internal Medicine

## 2011-10-13 DIAGNOSIS — Z1231 Encounter for screening mammogram for malignant neoplasm of breast: Secondary | ICD-10-CM

## 2011-10-15 ENCOUNTER — Encounter: Payer: Self-pay | Admitting: Obstetrics & Gynecology

## 2011-10-27 ENCOUNTER — Ambulatory Visit (HOSPITAL_COMMUNITY): Payer: Medicaid Other

## 2011-11-12 ENCOUNTER — Ambulatory Visit (HOSPITAL_COMMUNITY)
Admission: RE | Admit: 2011-11-12 | Discharge: 2011-11-12 | Disposition: A | Discharge status: Home / Self Care | Payer: Self-pay | Source: Ambulatory Visit | Attending: Internal Medicine | Admitting: Internal Medicine

## 2011-11-12 DIAGNOSIS — Z1231 Encounter for screening mammogram for malignant neoplasm of breast: Secondary | ICD-10-CM

## 2012-04-12 ENCOUNTER — Ambulatory Visit (INDEPENDENT_AMBULATORY_CARE_PROVIDER_SITE_OTHER): Payer: BC Managed Care – PPO | Admitting: General Surgery

## 2012-04-20 ENCOUNTER — Ambulatory Visit (INDEPENDENT_AMBULATORY_CARE_PROVIDER_SITE_OTHER): Payer: BC Managed Care – PPO | Admitting: General Surgery

## 2012-04-20 ENCOUNTER — Encounter (INDEPENDENT_AMBULATORY_CARE_PROVIDER_SITE_OTHER): Payer: Self-pay | Admitting: General Surgery

## 2012-04-20 VITALS — BP 136/62 | HR 84 | Temp 97.2°F | Resp 16 | Ht 65.5 in | Wt 312.4 lb

## 2012-04-20 DIAGNOSIS — Z6841 Body Mass Index (BMI) 40.0 and over, adult: Secondary | ICD-10-CM

## 2012-04-20 NOTE — Progress Notes (Signed)
Patient ID: Janet [name], female   DOB: [birth year], 47 y.o.   MRN: [id number]  Chief Complaint  Patient presents with  . Bariatric Pre-op    Initial    HPI Janet [name] is a 47 y.o. female.   HPI  47 yo morbidly obese AAF referred by Dr Clyde Canterbury for evaluation of weight loss surgery. The patient states that she has struggled with her weight her entire adult life. Despite numerous attempts were sustained weight loss she has been unsuccessful. She has tried Weight Watchers, Nutrisystem, Slim fast, herbal life, and phentermine all without any long-term success.She was most successful with Weight Watchers but ultimately regained all the weight.  She has several friends who have had weight loss surgery specifically a laparoscopic adjustable gastric band. She is leaning more toward gastric bypass or sleeve gastrectomy. Past Medical History  Diagnosis Date  . Obesity   . Chronic venous insufficiency     2D ECHO EF 55-60%. Nl LFT's, Nl renal fxn. Refused conpression stockings.  . Chronic abdominal pain     Once a month, lasts 3-4 days, could have been related to IUD, now resolved.  Marland Kitchen GERD (gastroesophageal reflux disease)   . Spontaneous abortion 2003    at 3 months gestation  . Heme positive stool     Colonoscopy done, [facility] 12/2004, diverticulosis in sigmoid. EGD 12/2004 normal.  . Ovarian enlargement, left 01/2005    4.7/3 cm on CT    Past Surgical History  Procedure Laterality Date  . Cesarean section    . Carpal tunnel release      Family History  Problem Relation Age of Onset  . Kidney disease Mother   . Diabetes Mother   . Heart disease Mother     CHF  . Hypertension Mother   . Rheum arthritis Mother   . Heart disease Father     CHF  . Alcohol abuse Father   . Hypertension Father     Social History History  Substance Use Topics  . Smoking status: Never Smoker   . Smokeless tobacco: Never Used  . Alcohol Use: No    Allergies  Allergen Reactions  .  Shellfish Allergy Swelling    No current outpatient prescriptions on file.   No current facility-administered medications for this visit.    Review of Systems Review of Systems  Constitutional: Negative for fever, chills and unexpected weight change.  HENT: Negative for hearing loss, congestion, sore throat, trouble swallowing and voice change.   Eyes: Negative for visual disturbance.  Respiratory: Positive for chest tightness. Negative for cough, shortness of breath and wheezing.        Epworth sleepiness scale score 2  Cardiovascular: Positive for chest pain. Negative for palpitations and leg swelling.       Some chest pain/tightness with exercise. Denies SOB, PND, orthopnea, DOE. Some intermittent LE swelling. Denies leg ulcers  Gastrointestinal: Negative for nausea, vomiting, abdominal pain, diarrhea, constipation, blood in stool, abdominal distention and anal bleeding.       BM qod  Genitourinary: Negative for hematuria, vaginal bleeding and difficulty urinating.       +IUD; G3P2; 2 c/sections  Musculoskeletal: Negative for arthralgias.  Skin: Negative for rash and wound.  Neurological: Negative for seizures, syncope and headaches.       Denies TIAs, amaurosis fugax  Hematological: Negative for adenopathy. Does not bruise/bleed easily.  Psychiatric/Behavioral: Negative for confusion.    Blood pressure 136/62, pulse 84, temperature 97.2 F (36.2 C), temperature source  Temporal, resp. rate 16, height 5' 5.5" (1.664 m), weight 312 lb 6.4 oz (141.704 kg).  Physical Exam Physical Exam  Vitals reviewed. Constitutional: She is oriented to person, place, and time. She appears well-developed and well-nourished. No distress.  Morbidly obese  HENT:  Head: Normocephalic and atraumatic.  Right Ear: External ear normal.  Left Ear: External ear normal.  Eyes: Conjunctivae are normal. No scleral icterus.  Neck: Normal range of motion. Neck supple. No tracheal deviation present. No  thyromegaly present.  Cardiovascular: Normal rate, regular rhythm and normal heart sounds.   Pulmonary/Chest: Effort normal and breath sounds normal. No respiratory distress. She has no wheezes.  Abdominal: Soft. She exhibits no distension. There is no tenderness. There is no rebound and no guarding.    Musculoskeletal: Normal range of motion. She exhibits no tenderness.  Trace b/l LE edema  Lymphadenopathy:    She has no cervical adenopathy.  Neurological: She is alert and oriented to person, place, and time. She exhibits normal muscle tone.  Skin: Skin is warm and dry. No rash noted. She is not diaphoretic. No erythema.  Psychiatric: She has a normal mood and affect. Her behavior is normal. Judgment and thought content normal.    Data Reviewed Jan 2007 echo - ef normal.   Assessment    Morbid obese BMI 51 Chest pain with exertion  GERD - no meds Elevated blood pressure    Plan    The patient meets weight loss surgery criteria. I think the patient would be an acceptable candidate for Laparoscopic Roux-en-Y Gastric bypass.   We discussed laparoscopic Roux-en-Y gastric bypass. We discussed the preoperative, operative and postoperative process. Using diagrams, I explained the surgery in detail including the performance of an EGD near the end of the surgery and an Upper GI swallow study on POD 1. We discussed the typical hospital course including a 2-3 day stay baring any complications.   The patient was given educational material. I quoted the patient that they can expect to lose 50-70% of their excess weight with the gastric bypass. We did discuss the possibility of weight regain several years after the procedure.  We discussed sleeve gastrectomy. She was shown diagrams. We discussed the surgical steps. We discussed how it differs from gastric bypass. We also discussed the inherent specific risk with a sleeve gastrectomy.  We discussed the risk and benefits of Gastric bypass surgery  including but not limited to anesthesia risk, bleeding, infection, anastomotic edema requiring a few additional days in the hospital, postop nausea, blood clot formation, anastomotic leak, anastomotic stricture, ulcer formation, death, respiratory complications, intestinal blockage, internal hernia, gallstone formation, vitamin and nutritional deficiencies, injury to surrounding structures, failure to lose weight and mood changes.  We discussed that before and after surgery that there would be an alteration in their diet. I explained that we have put them on a diet 2 weeks before surgery. I also explained that they would be on a liquid diet for 2 weeks after surgery. We discussed that they would have to avoid certain foods such as sugar after surgery. We discussed the importance of physical activity as well as compliance with our dietary and supplement recommendations and routine follow-up.  I explained to the patient that we will start our evaluation process which includes labs, Upper GI to evaluate stomach and swallowing anatomy, nutritionist consultation, psychiatrist consultation, EKG, CXR, abdominal ultrasound, Evaluation by her primary care physician to determine whether or not a cardiology evaluation as needed because of her  complaints of some chest pain-tightness with exertion.  Mary Sella. Andrey Campanile, MD, FACS General, Bariatric, & Minimally Invasive Surgery [facility], [place]          [facility] M 04/20/2012, 3:10 PM

## 2012-04-20 NOTE — Patient Instructions (Signed)
We will start our workup

## 2012-04-21 ENCOUNTER — Other Ambulatory Visit: Payer: Self-pay | Admitting: Family Medicine

## 2012-04-21 ENCOUNTER — Other Ambulatory Visit (HOSPITAL_COMMUNITY)
Admission: RE | Admit: 2012-04-21 | Discharge: 2012-04-21 | Disposition: A | Discharge status: Home / Self Care | Payer: BC Managed Care – PPO | Source: Ambulatory Visit | Attending: Family Medicine | Admitting: Family Medicine

## 2012-04-21 DIAGNOSIS — Z124 Encounter for screening for malignant neoplasm of cervix: Secondary | ICD-10-CM | POA: Insufficient documentation

## 2012-05-05 ENCOUNTER — Ambulatory Visit (HOSPITAL_COMMUNITY): Admission: RE | Admit: 2012-05-05 | Payer: BC Managed Care – PPO | Source: Ambulatory Visit

## 2012-05-05 ENCOUNTER — Ambulatory Visit (HOSPITAL_COMMUNITY): Payer: BC Managed Care – PPO

## 2012-05-11 ENCOUNTER — Ambulatory Visit: Payer: Medicaid Other | Admitting: *Deleted

## 2012-05-16 ENCOUNTER — Ambulatory Visit: Payer: Medicaid Other | Admitting: *Deleted

## 2013-02-21 ENCOUNTER — Other Ambulatory Visit: Payer: Self-pay | Admitting: Family Medicine

## 2013-02-21 ENCOUNTER — Ambulatory Visit
Admission: RE | Admit: 2013-02-21 | Discharge: 2013-02-21 | Disposition: A | Discharge status: Home / Self Care | Payer: BC Managed Care – PPO | Source: Ambulatory Visit | Attending: Family Medicine | Admitting: Family Medicine

## 2013-02-21 DIAGNOSIS — M549 Dorsalgia, unspecified: Secondary | ICD-10-CM

## 2013-02-21 DIAGNOSIS — R1032 Left lower quadrant pain: Secondary | ICD-10-CM

## 2013-05-04 ENCOUNTER — Encounter: Payer: Self-pay | Admitting: Obstetrics & Gynecology

## 2013-05-04 ENCOUNTER — Ambulatory Visit (INDEPENDENT_AMBULATORY_CARE_PROVIDER_SITE_OTHER): Payer: BC Managed Care – PPO | Admitting: Medical

## 2013-05-04 VITALS — BP 124/83 | HR 77 | Temp 98.0°F | Ht 65.5 in | Wt 331.0 lb

## 2013-05-04 DIAGNOSIS — N912 Amenorrhea, unspecified: Secondary | ICD-10-CM

## 2013-05-04 DIAGNOSIS — Z30432 Encounter for removal of intrauterine contraceptive device: Secondary | ICD-10-CM | POA: Diagnosis not present

## 2013-05-04 LAB — FOLLICLE STIMULATING HORMONE: FSH: 4.5 m[IU]/mL

## 2013-05-04 LAB — POCT PREGNANCY, URINE: PREG TEST UR: NEGATIVE

## 2013-05-04 NOTE — Progress Notes (Signed)
Patient ID: Janet [name], female   DOB: [birth year], 48 y.o.   MRN: [id number]     [facility] PROCEDURE NOTE  LISVET RASHEED is a 48 y.o. [id number] here for Mirena IUD removal. No GYN concerns. Patient states that she has not had any significant bleeding since IUD was inserted 05/2006. IUD appears to be a [place]. Patient may be post-menopausal given age, amenorrhea and unlikelihood that IUD was still functioning and negative pregnancy test today.   IUD Removal  Patient was in the dorsal lithotomy position, normal external genitalia was noted.  A speculum was placed in the patient's vagina, normal discharge was noted, no lesions. The multiparous cervix was visualized, no lesions, no abnormal discharge.  The strings of the IUD were grasped and pulled using ring forceps. The IUD was removed in its entirety. Patient tolerated the procedure well.    Patient desires another IUD at this time.    IUD Insertion Procedure Note Patient identified, informed consent performed.  Discussed risks of irregular bleeding, cramping, infection, malpositioning or misplacement of the IUD outside the uterus which may require further procedure such as laparoscopy. Time out was performed.  Urine pregnancy test negative.  Speculum placed in the vagina.  Cervix visualized.  Betadine not used as patient is allergic to shellfish and unsure if Iodine has been used previously.  Grasped anteriorly with a single tooth tenaculum.  Unable to insert IUD secondary to cervical stenosis. Multiple sounds used and unable to pass the internal os. Patient states that she would rather try a different birth control if IUD was not easily placed today. Tenaculum was removed, good hemostasis noted.  Patient tolerated procedure well.    MDM Discussed patient with Dr. Roselie Awkward. Given patient's age and amenorrhea will check [place] today to determine menopausal status and whether birth control is needed or not.   A: IUD removal  P: [place] drawn  today Patient advised to abstain or use condoms until contacted with the results Patient may return to [facility] as needed or for birth control initiation depending on results of labs  Farris Has, PA-C 05/04/2013 12:06 PM

## 2013-05-04 NOTE — Progress Notes (Signed)
Patient would like iud removed and another iud put in.

## 2013-05-04 NOTE — Patient Instructions (Signed)
Perimenopause  Perimenopause is the time when your body begins to move into the menopause (no menstrual period for 12 straight months). It is a natural process. Perimenopause can begin 2 8 years before the menopause and usually lasts for 1 year after the menopause. During this time, your ovaries may or may not produce an egg. The ovaries vary in their production of estrogen and progesterone hormones each month. This can cause irregular menstrual periods, difficulty getting pregnant, vaginal bleeding between periods, and uncomfortable symptoms.  CAUSES  · Irregular production of the ovarian hormones, estrogen and progesterone, and not ovulating every month.  · Other causes include:  · Tumor of the pituitary gland in the brain.  · Medical disease that affects the ovaries.  · Radiation treatment.  · Chemotherapy.  · Unknown causes.  · Heavy smoking and excessive alcohol intake can bring on perimenopause sooner.  SIGNS AND SYMPTOMS   · Hot flashes.  · Night sweats.  · Irregular menstrual periods.  · Decreased sex drive.  · Vaginal dryness.  · Headaches.  · Mood swings.  · Depression.  · Memory problems.  · Irritability.  · Tiredness.  · Weight gain.  · Trouble getting pregnant.  · The beginning of losing bone cells (osteoporosis).  · The beginning of hardening of the arteries (atherosclerosis).  DIAGNOSIS   Your health care provider will make a diagnosis by analyzing your age, menstrual history, and symptoms. He or she will do a physical exam and note any changes in your body, especially your female organs. Female hormone tests may or may not be helpful depending on the amount of female hormones you produce and when you produce them. However, other hormone tests may be helpful to rule out other problems.  TREATMENT   In some cases, no treatment is needed. The decision on whether treatment is necessary during the perimenopause should be made by you and your health care provider based on how the symptoms are affecting you  and your lifestyle. Various treatments are available, such as:  · Treating individual symptoms with a specific medicine for that symptom.  · Herbal medicines that can help specific symptoms.  · Counseling.  · Group therapy.  HOME CARE INSTRUCTIONS   · Keep track of your menstrual periods (when they occur, how heavy they are, how long between periods, and how long they last) as well as your symptoms and when they started.  · Only take over-the-counter or prescription medicines as directed by your health care provider.  · Sleep and rest.  · Exercise.  · Eat a diet that contains calcium (good for your bones) and soy (acts like the estrogen hormone).  · Do not smoke.  · Avoid alcoholic beverages.  · Take vitamin supplements as recommended by your health care provider. Taking vitamin E may help in certain cases.  · Take calcium and vitamin D supplements to help prevent bone loss.  · Group therapy is sometimes helpful.  · Acupuncture may help in some cases.  SEEK MEDICAL CARE IF:   · You have questions about any symptoms you are having.  · You need a referral to a specialist (gynecologist, psychiatrist, or psychologist).  SEEK IMMEDIATE MEDICAL CARE IF:   · You have vaginal bleeding.  · Your period lasts longer than 8 days.  · Your periods are recurring sooner than 21 days.  · You have bleeding after intercourse.  · You have severe depression.  · You have pain when you urinate.  · 

## 2013-05-07 ENCOUNTER — Telehealth: Payer: Self-pay

## 2013-05-07 NOTE — Telephone Encounter (Signed)
Called pt. And informed her that she is not post-menopausal and that she needs birth control. Pt. States she thinks she would like the nexplanon. Informed pt. I would pass information to front desk and they will schedule her appointment, next available is mid-may. Pt. Verbalized understanding.

## 2013-05-07 NOTE — Telephone Encounter (Signed)
Message copied by Geanie Logan on Mon May 07, 2013  9:47 AM ------      Message from: Farris Has      Created: Sun May 06, 2013  2:17 PM       Please call patient and inform her that she is not post-menopausal. She will need to make an appointment to come in to discuss birth control. I attempted to put in an IUD the other day and was unsuccessful. She could either have an MD attempt IUD insertion or see any provider for birth control counseling and initiation.             Thanks,             Almyra Free ------

## 2013-05-09 ENCOUNTER — Encounter: Payer: Self-pay | Admitting: *Deleted

## 2013-05-10 ENCOUNTER — Other Ambulatory Visit: Payer: Self-pay | Admitting: Family Medicine

## 2013-05-10 DIAGNOSIS — R109 Unspecified abdominal pain: Secondary | ICD-10-CM

## 2013-05-23 ENCOUNTER — Other Ambulatory Visit: Payer: BC Managed Care – PPO

## 2013-06-01 ENCOUNTER — Ambulatory Visit: Payer: BC Managed Care – PPO | Admitting: Nurse Practitioner

## 2013-07-19 ENCOUNTER — Ambulatory Visit (HOSPITAL_COMMUNITY)
Admission: RE | Admit: 2013-07-19 | Discharge: 2013-07-19 | Disposition: A | Discharge status: Home / Self Care | Payer: BC Managed Care – PPO | Source: Ambulatory Visit | Attending: Family Medicine | Admitting: Family Medicine

## 2013-07-19 ENCOUNTER — Other Ambulatory Visit (HOSPITAL_COMMUNITY): Payer: Self-pay | Admitting: Family Medicine

## 2013-07-19 DIAGNOSIS — M79609 Pain in unspecified limb: Secondary | ICD-10-CM

## 2013-07-19 DIAGNOSIS — M25562 Pain in left knee: Secondary | ICD-10-CM

## 2013-07-19 NOTE — Progress Notes (Signed)
VASCULAR LAB PRELIMINARY  PRELIMINARY  PRELIMINARY  PRELIMINARY  Left lower extremity venous Doppler completed.    Preliminary report:  There is no DVT or SVT noted in the left lower extremity.   Arisbel Maione, RVT 07/19/2013, 1:52 PM

## 2013-07-24 ENCOUNTER — Other Ambulatory Visit: Payer: Self-pay | Admitting: Family Medicine

## 2013-07-24 DIAGNOSIS — R1011 Right upper quadrant pain: Secondary | ICD-10-CM

## 2013-07-24 DIAGNOSIS — R1031 Right lower quadrant pain: Secondary | ICD-10-CM

## 2013-07-26 ENCOUNTER — Ambulatory Visit (HOSPITAL_COMMUNITY)
Admission: RE | Admit: 2013-07-26 | Discharge: 2013-07-26 | Disposition: A | Discharge status: Home / Self Care | Payer: BC Managed Care – PPO | Source: Ambulatory Visit | Attending: Family Medicine | Admitting: Family Medicine

## 2013-07-26 DIAGNOSIS — R1011 Right upper quadrant pain: Secondary | ICD-10-CM

## 2013-07-26 DIAGNOSIS — K7689 Other specified diseases of liver: Secondary | ICD-10-CM | POA: Insufficient documentation

## 2013-07-26 DIAGNOSIS — R1031 Right lower quadrant pain: Secondary | ICD-10-CM

## 2013-07-31 ENCOUNTER — Ambulatory Visit
Admission: RE | Admit: 2013-07-31 | Discharge: 2013-07-31 | Disposition: A | Discharge status: Home / Self Care | Payer: BC Managed Care – PPO | Source: Ambulatory Visit | Attending: Family Medicine | Admitting: Family Medicine

## 2013-07-31 DIAGNOSIS — R109 Unspecified abdominal pain: Secondary | ICD-10-CM

## 2013-08-06 ENCOUNTER — Ambulatory Visit (INDEPENDENT_AMBULATORY_CARE_PROVIDER_SITE_OTHER): Payer: BC Managed Care – PPO | Admitting: Obstetrics & Gynecology

## 2013-08-06 ENCOUNTER — Encounter: Payer: Self-pay | Admitting: Obstetrics & Gynecology

## 2013-08-06 VITALS — BP 128/82 | HR 81 | Temp 98.2°F | Ht 65.0 in | Wt 313.7 lb

## 2013-08-06 DIAGNOSIS — Z0000 Encounter for general adult medical examination without abnormal findings: Secondary | ICD-10-CM

## 2013-08-06 DIAGNOSIS — Z30430 Encounter for insertion of intrauterine contraceptive device: Secondary | ICD-10-CM

## 2013-08-06 LAB — POCT PREGNANCY, URINE: Preg Test, Ur: NEGATIVE

## 2013-08-06 MED ORDER — LEVONORGESTREL 20 MCG/24HR IU IUD
INTRAUTERINE_SYSTEM | Freq: Once | INTRAUTERINE | Status: AC
  Administered 2013-08-06: 1 via INTRAUTERINE

## 2013-08-06 NOTE — Progress Notes (Signed)
   Subjective:    Patient ID: Janet [name], female    DOB: 08-29-1965, 48 y.o.   MRN: [id number]  HPI 48 yo AA lady who had a Mirena removed 4/15. At that time, the PA was unable to replace it. She has been on OCPs since then and she wants the Mirena replaced.   Review of Systems She is planning to have gastric bypass next week on the 27th.    Objective:   Physical Exam  UPT negative, consent signed, Time out procedure done. Cervix prepped with 2 OB towelletes as she is allergic to shellfish. The cervix was grasped with a single tooth tenaculum. Mirena was easily placed and the strings were cut to 3-4 cm. Uterus sounded to 9 cm. She tolerated the procedure well.        Assessment & Plan:  Menorrhagia/contraception- Mirena replaced She can stop the OCPs in 2 weeks

## 2013-08-14 ENCOUNTER — Ambulatory Visit (HOSPITAL_COMMUNITY)
Admission: RE | Admit: 2013-08-14 | Discharge: 2013-08-14 | Disposition: A | Discharge status: Home / Self Care | Payer: BC Managed Care – PPO | Source: Ambulatory Visit | Attending: Obstetrics & Gynecology | Admitting: Obstetrics & Gynecology

## 2013-08-14 ENCOUNTER — Other Ambulatory Visit: Payer: Self-pay | Admitting: Obstetrics & Gynecology

## 2013-08-14 DIAGNOSIS — Z1231 Encounter for screening mammogram for malignant neoplasm of breast: Secondary | ICD-10-CM

## 2013-08-14 DIAGNOSIS — Z0000 Encounter for general adult medical examination without abnormal findings: Secondary | ICD-10-CM

## 2013-08-18 DIAGNOSIS — J189 Pneumonia, unspecified organism: Secondary | ICD-10-CM

## 2013-08-18 HISTORY — DX: Pneumonia, unspecified organism: J18.9

## 2013-08-21 ENCOUNTER — Emergency Department (HOSPITAL_COMMUNITY)
Admission: EM | Admit: 2013-08-21 | Discharge: 2013-08-21 | Disposition: A | Discharge status: Home / Self Care | Payer: BC Managed Care – PPO | Attending: Emergency Medicine | Admitting: Emergency Medicine

## 2013-08-21 ENCOUNTER — Encounter (HOSPITAL_COMMUNITY): Payer: Self-pay | Admitting: Emergency Medicine

## 2013-08-21 ENCOUNTER — Emergency Department (HOSPITAL_COMMUNITY): Payer: BC Managed Care – PPO

## 2013-08-21 DIAGNOSIS — R079 Chest pain, unspecified: Secondary | ICD-10-CM | POA: Insufficient documentation

## 2013-08-21 DIAGNOSIS — J159 Unspecified bacterial pneumonia: Secondary | ICD-10-CM | POA: Insufficient documentation

## 2013-08-21 DIAGNOSIS — Z8719 Personal history of other diseases of the digestive system: Secondary | ICD-10-CM | POA: Insufficient documentation

## 2013-08-21 DIAGNOSIS — Z8679 Personal history of other diseases of the circulatory system: Secondary | ICD-10-CM | POA: Insufficient documentation

## 2013-08-21 DIAGNOSIS — G8929 Other chronic pain: Secondary | ICD-10-CM | POA: Insufficient documentation

## 2013-08-21 DIAGNOSIS — Z8742 Personal history of other diseases of the female genital tract: Secondary | ICD-10-CM | POA: Insufficient documentation

## 2013-08-21 DIAGNOSIS — J189 Pneumonia, unspecified organism: Secondary | ICD-10-CM

## 2013-08-21 DIAGNOSIS — E669 Obesity, unspecified: Secondary | ICD-10-CM | POA: Insufficient documentation

## 2013-08-21 LAB — BASIC METABOLIC PANEL
Anion gap: 13 (ref 5–15)
BUN: 11 mg/dL (ref 6–23)
CALCIUM: 10.1 mg/dL (ref 8.4–10.5)
CO2: 24 meq/L (ref 19–32)
CREATININE: 1.1 mg/dL (ref 0.50–1.10)
Chloride: 98 mEq/L (ref 96–112)
GFR calc Af Amer: 68 mL/min — ABNORMAL LOW (ref >90)
GFR, EST NON AFRICAN AMERICAN: 58 mL/min — AB (ref >90)
Glucose, Bld: 95 mg/dL (ref 70–99)
Potassium: 4.1 mEq/L (ref 3.7–5.3)
SODIUM: 135 meq/L — AB (ref 137–147)

## 2013-08-21 LAB — CBC
HCT: 37.5 % (ref 36.0–46.0)
Hemoglobin: 12.5 g/dL (ref 12.0–15.0)
MCH: 28.2 pg (ref 26.0–34.0)
MCHC: 33.3 g/dL (ref 30.0–36.0)
MCV: 84.7 fL (ref 78.0–100.0)
Platelets: 349 10*3/uL (ref 150–400)
RBC: 4.43 MIL/uL (ref 3.87–5.11)
RDW: 13.7 % (ref 11.5–15.5)
WBC: 12.3 10*3/uL — ABNORMAL HIGH (ref 4.0–10.5)

## 2013-08-21 LAB — PRO B NATRIURETIC PEPTIDE: Pro B Natriuretic peptide (BNP): 17.6 pg/mL (ref 0–125)

## 2013-08-21 LAB — I-STAT TROPONIN, ED: Troponin i, poc: 0 ng/mL (ref 0.00–0.08)

## 2013-08-21 MED ORDER — NAPROXEN 500 MG PO TABS: 500.0000 mg | ORAL_TABLET | Freq: Two times a day (BID) | ORAL | Status: DC

## 2013-08-21 MED ORDER — ONDANSETRON 4 MG PO TBDP
4.0000 mg | ORAL_TABLET | Freq: Once | ORAL | Status: AC
  Administered 2013-08-21: 4 mg via ORAL
  Filled 2013-08-21: qty 1

## 2013-08-21 MED ORDER — LEVOFLOXACIN 500 MG PO TABS: 500.0000 mg | ORAL_TABLET | Freq: Every day | ORAL | Status: DC

## 2013-08-21 MED ORDER — LEVOFLOXACIN 500 MG PO TABS
750.0000 mg | ORAL_TABLET | Freq: Once | ORAL | Status: AC
  Administered 2013-08-21: 750 mg via ORAL
  Filled 2013-08-21: qty 2

## 2013-08-21 MED ORDER — HYDROCODONE-ACETAMINOPHEN 5-325 MG PO TABS
1.0000 | ORAL_TABLET | Freq: Once | ORAL | Status: AC
  Administered 2013-08-21: 1 via ORAL
  Filled 2013-08-21: qty 1

## 2013-08-21 MED ORDER — ONDANSETRON HCL 4 MG PO TABS: 4.0000 mg | ORAL_TABLET | Freq: Four times a day (QID) | ORAL | Status: DC

## 2013-08-21 MED ORDER — HYDROCODONE-ACETAMINOPHEN 5-325 MG PO TABS: 1.0000 | ORAL_TABLET | ORAL | Status: DC | PRN

## 2013-08-21 NOTE — ED Notes (Signed)
Urine collected, is at [facility].

## 2013-08-21 NOTE — ED Notes (Signed)
Pt reports back and chest pain with coughing since Sunday.  Became worse today.  Pt also reports low grade fever of 99.

## 2013-08-21 NOTE — Discharge Instructions (Signed)
Pneumonia, Adult  Pneumonia is an infection of the lungs. It may be caused by a germ (virus or bacteria). Some types of pneumonia can spread easily from person to person. This can happen when you cough or sneeze.  HOME CARE   Only take medicine as told by your doctor.   Take your medicine (antibiotics) as told. Finish it even if you start to feel better.   Do not smoke.   You may use a vaporizer or humidifier in your room. This can help loosen thick spit (mucus).   Sleep so you are almost sitting up (semi-upright). This helps reduce coughing.   Rest.  A shot (vaccine) can help prevent pneumonia. Shots are often advised for:   People over 65 years old.   Patients on chemotherapy.   People with long-term (chronic) lung problems.   People with immune system problems.  GET HELP RIGHT AWAY IF:    You are getting worse.   You cannot control your cough, and you are losing sleep.   You cough up blood.   Your pain gets worse, even with medicine.   You have a fever.   Any of your problems are getting worse, not better.   You have shortness of breath or chest pain.  MAKE SURE YOU:    Understand these instructions.   Will watch your condition.   Will get help right away if you are not doing well or get worse.  Document Released: 06/23/2007 Document Revised: 03/29/2011 Document Reviewed: 03/27/2010  ExitCare Patient Information 2015 [employer]. This information is not intended to replace advice given to you by your health care provider. Make sure you discuss any questions you have with your health care provider.

## 2013-08-21 NOTE — ED Provider Notes (Signed)
CSN: [id number]     Arrival date & time 08/21/13  1832 History   First MD Initiated Contact with Patient 08/21/13 2115     Chief Complaint  Patient presents with  . Back Pain  . Chest Pain  . Neck Injury  . Shortness of Breath      HPI  Patient presents with some left lower lateral chest pain. Had a cough not felt well at home for last 48 hours. Cough nonproductive. No hemoptysis. Subjective fever. Good by mouth intake without nausea vomiting. No diarrhea. No dysuria. No extremity pain or swelling. No prolonged immobilization cast splints fractures surgeries malignancies or other DVT or PE risks. Family history negative for DVT, PE, or heart disease.  Past Medical History  Diagnosis Date  . Obesity   . Chronic venous insufficiency     2D ECHO EF 55-60%. Nl LFT's, Nl renal fxn. Refused conpression stockings.  . Chronic abdominal pain     Once a month, lasts 3-4 days, could have been related to IUD, now resolved.  Marland Kitchen GERD (gastroesophageal reflux disease)   . Spontaneous abortion 2003    at 3 months gestation  . Heme positive stool     Colonoscopy done, [facility] 12/2004, diverticulosis in sigmoid. EGD 12/2004 normal.  . Ovarian enlargement, left 01/2005    4.7/3 cm on CT   Past Surgical History  Procedure Laterality Date  . Cesarean section    . Carpal tunnel release     Family History  Problem Relation Age of Onset  . Kidney disease Mother   . Diabetes Mother   . Heart disease Mother     CHF  . Hypertension Mother   . Rheum arthritis Mother   . Heart disease Father     CHF  . Alcohol abuse Father   . Hypertension Father    History  Substance Use Topics  . Smoking status: Never Smoker   . Smokeless tobacco: Never Used  . Alcohol Use: No   OB History   Grav Para Term Preterm Abortions TAB SAB Ect Mult Living   3 2 2  1  1   2      Review of Systems  Constitutional: Negative for fever, chills, diaphoresis, appetite change and fatigue.  HENT: Negative for mouth sores,  sore throat and trouble swallowing.   Eyes: Negative for visual disturbance.  Respiratory: Positive for cough and shortness of breath. Negative for chest tightness and wheezing.   Cardiovascular: Positive for chest pain.  Gastrointestinal: Negative for nausea, vomiting, abdominal pain, diarrhea and abdominal distention.  Endocrine: Negative for polydipsia, polyphagia and polyuria.  Genitourinary: Negative for dysuria, frequency and hematuria.  Musculoskeletal: Negative for gait problem.  Skin: Negative for color change, pallor and rash.  Neurological: Negative for dizziness, syncope, light-headedness and headaches.  Hematological: Does not bruise/bleed easily.  Psychiatric/Behavioral: Negative for behavioral problems and confusion.      Allergies  Shellfish allergy and Tramadol  Home Medications   Prior to Admission medications   Medication Sig Start Date End Date Taking? Authorizing Provider  HYDROcodone-acetaminophen (NORCO/VICODIN) 5-325 MG per tablet Take 1 tablet by mouth every 6 (six) hours as needed for moderate pain.    Historical Provider, MD   BP 146/75  Pulse 92  Temp(Src) 99.5 F (37.5 C) (Oral)  Resp 20  SpO2 100%  LMP 08/05/2013 Physical Exam  Constitutional: She is oriented to person, place, and time. She appears well-developed and well-nourished. No distress.  HENT:  Head: Normocephalic.  Eyes:  Conjunctivae are normal. Pupils are equal, round, and reactive to light. No scleral icterus.  Neck: Normal range of motion. Neck supple. No thyromegaly present.  Cardiovascular: Normal rate and regular rhythm.  Exam reveals no gallop and no friction rub.   No murmur heard. Pulmonary/Chest: Effort normal. No respiratory distress. She has no wheezes. She has rales in the left middle field and the left lower field.    Abdominal: Soft. Bowel sounds are normal. She exhibits no distension. There is no tenderness. There is no rebound.  Musculoskeletal: Normal range of  motion.  Neurological: She is alert and oriented to person, place, and time.  Skin: Skin is warm and dry. No rash noted.  Psychiatric: She has a normal mood and affect. Her behavior is normal.    ED Course  Procedures (including critical care time) Labs Review Labs Reviewed  CBC - Abnormal; Notable for the following:    WBC 12.3 (*)    All other components within normal limits  BASIC METABOLIC PANEL - Abnormal; Notable for the following:    Sodium 135 (*)    GFR calc non Af Amer 58 (*)    GFR calc Af Amer 68 (*)    All other components within normal limits  PRO B NATRIURETIC PEPTIDE  I-STAT TROPOININ, ED    Imaging Review Dg Chest 2 View  08/21/2013   CLINICAL DATA:  Shortness of breath, chest pain, left-sided neck pain, back pain  EXAM: CHEST  2 VIEW  COMPARISON:  12/15/2010; 07/30/2008; CT abdomen pelvis- 07/31/2013  FINDINGS: Grossly unchanged cardiac silhouette and mediastinal contours given decreased lung volumes. Worsening bibasilar heterogeneous opacities, left greater than right. No pleural effusion or pneumothorax. No evidence of edema. No acute osseus abnormalities.  IMPRESSION: Decreased lung volumes with worsening bibasilar opacities, left greater than right, likely atelectasis though developing infection not excluded. A follow-up chest radiograph in 4 to 6 weeks after treatment is recommended to ensure resolution.   Electronically Signed   By: Sandi Mariscal M.D.   On: 08/21/2013 19:16     EKG Interpretation   Date/Time:  Tuesday August 21 2013 18:43:24 EDT Ventricular Rate:  82 PR Interval:  171 QRS Duration: 78 QT Interval:  350 QTC Calculation: 409 R Axis:   7 Text Interpretation:  Sinus rhythm Low voltage, precordial leads  Borderline T abnormalities, inferior leads No significant change since  last tracing Confirmed by WARD,  DO, KRISTEN [phone]) on 08/21/2013 6:46:27  PM Also confirmed by Jeneen Rinks  MD, [place] ([id number])  on 08/21/2013 9:25:26 PM      MDM   Final  diagnoses:  Community acquired pneumonia    Pt with LLL infiltrate on CXR.  No concern for PE.  100% sats.  HR 82.  No leg swelling.  H/O fever, cough, and LL chest pain.  Plan:  Levaquin, vicoden, NSAIDs.    Tanna Furry, MD 08/21/13 2158

## 2013-08-21 NOTE — ED Notes (Signed)
Pt c/o back pain, neck pain, chest pain and SOB since Sunday. Pt denies n/v.

## 2013-09-03 ENCOUNTER — Ambulatory Visit (INDEPENDENT_AMBULATORY_CARE_PROVIDER_SITE_OTHER): Payer: BC Managed Care – PPO | Admitting: Medical

## 2013-09-03 ENCOUNTER — Encounter: Payer: Self-pay | Admitting: Medical

## 2013-09-03 VITALS — BP 122/54 | HR 72 | Temp 98.7°F | Ht 65.5 in | Wt 314.0 lb

## 2013-09-03 DIAGNOSIS — Z30431 Encounter for routine checking of intrauterine contraceptive device: Secondary | ICD-10-CM | POA: Diagnosis not present

## 2013-09-03 NOTE — Patient Instructions (Signed)
Intrauterine Device Information An intrauterine device (IUD) is inserted into your uterus to prevent pregnancy. There are two types of IUDs available:   Copper IUD--This type of IUD is wrapped in copper wire and is placed inside the uterus. Copper makes the uterus and fallopian tubes produce a fluid that kills sperm. The copper IUD can stay in place for 10 years.  Hormone IUD--This type of IUD contains the hormone progestin (synthetic progesterone). The hormone thickens the cervical mucus and prevents sperm from entering the uterus. It also thins the uterine lining to prevent implantation of a fertilized egg. The hormone can weaken or kill the sperm that get into the uterus. One type of hormone IUD can stay in place for 5 years, and another type can stay in place for 3 years. Your health care provider will make sure you are a good candidate for a contraceptive IUD. Discuss with your health care provider the possible side effects.  ADVANTAGES OF AN INTRAUTERINE DEVICE  IUDs are highly effective, reversible, long acting, and low maintenance.   There are no estrogen-related side effects.   An IUD can be used when breastfeeding.   IUDs are not associated with weight gain.   The copper IUD works immediately after insertion.   The hormone IUD works right away if inserted within 7 days of your period starting. You will need to use a backup method of birth control for 7 days if the hormone IUD is inserted at any other time in your cycle.  The copper IUD does not interfere with your female hormones.   The hormone IUD can make heavy menstrual periods lighter and decrease cramping.   The hormone IUD can be used for 3 or 5 years.   The copper IUD can be used for 10 years. DISADVANTAGES OF AN INTRAUTERINE DEVICE  The hormone IUD can be associated with irregular bleeding patterns.   The copper IUD can make your menstrual flow heavier and more painful.   You may experience cramping and  vaginal bleeding after insertion.  Document Released: 12/09/2003 Document Revised: 09/06/2012 Document Reviewed: 06/25/2012 ExitCare Patient Information 2015 [employer]. This information is not intended to replace advice given to you by your health care provider. Make sure you discuss any questions you have with your health care provider.  

## 2013-09-03 NOTE — Progress Notes (Signed)
Patient ID: [name], female   DOB: [birth year], 48 y.o.   MRN: [id number]  Ms. Janet [name] is a 48 y.o. [id number] who presents to [facility] today for an IUD string check. The patient had IUD placed on 08/06/13 by Dr. Hulan Fray. Patient states mild spotting. She denies pain or discharge.   BP 122/54  Pulse 72  Temp(Src) 98.7 F (37.1 C)  Ht 5' 5.5" (1.664 m)  Wt 314 lb (142.429 kg)  BMI 51.44 kg/m2  LMP 08/05/2013 GENERAL: Well-developed, well-nourished female in no acute distress.  HEENT: Normocephalic, atraumatic. Sclerae anicteric.  LUNGS: Normal effort HEART: Regular rate  PELVIC: Normal external female genitalia. Vagina is pink and rugated.  Scant brown spotting noted. Normal cervix contour. IUD strings are visualized and appear normal length  EXTREMITIES: No cyanosis, clubbing, or edema  A: IUD string check  P: Patient will be due for next pap smear 04/2015 Patient may return to [facility] as needed  Farris Has, PA-C 09/03/2013 3:47 PM

## 2013-09-07 ENCOUNTER — Encounter: Payer: Self-pay | Admitting: General Practice

## 2013-10-02 ENCOUNTER — Other Ambulatory Visit: Payer: Self-pay | Admitting: Family Medicine

## 2013-10-02 ENCOUNTER — Ambulatory Visit
Admission: RE | Admit: 2013-10-02 | Discharge: 2013-10-02 | Disposition: A | Discharge status: Home / Self Care | Payer: BC Managed Care – PPO | Source: Ambulatory Visit | Attending: Family Medicine | Admitting: Family Medicine

## 2013-10-02 ENCOUNTER — Ambulatory Visit: Payer: BC Managed Care – PPO | Attending: Family Medicine

## 2013-10-02 DIAGNOSIS — M542 Cervicalgia: Secondary | ICD-10-CM | POA: Insufficient documentation

## 2013-10-02 DIAGNOSIS — J189 Pneumonia, unspecified organism: Secondary | ICD-10-CM

## 2013-10-02 DIAGNOSIS — G5600 Carpal tunnel syndrome, unspecified upper limb: Secondary | ICD-10-CM | POA: Diagnosis not present

## 2013-10-02 DIAGNOSIS — M62838 Other muscle spasm: Secondary | ICD-10-CM | POA: Diagnosis not present

## 2013-10-02 DIAGNOSIS — IMO0001 Reserved for inherently not codable concepts without codable children: Secondary | ICD-10-CM | POA: Insufficient documentation

## 2013-10-08 ENCOUNTER — Ambulatory Visit: Payer: BC Managed Care – PPO | Admitting: Physical Therapy

## 2013-10-10 ENCOUNTER — Ambulatory Visit: Payer: BC Managed Care – PPO | Admitting: Physical Therapy

## 2013-10-15 ENCOUNTER — Encounter: Payer: BC Managed Care – PPO | Admitting: Physical Therapy

## 2013-10-17 ENCOUNTER — Encounter: Payer: BC Managed Care – PPO | Admitting: Physical Therapy

## 2013-10-29 ENCOUNTER — Ambulatory Visit: Payer: Self-pay

## 2013-10-29 ENCOUNTER — Other Ambulatory Visit: Payer: Self-pay | Admitting: Occupational Medicine

## 2013-10-29 DIAGNOSIS — R52 Pain, unspecified: Secondary | ICD-10-CM

## 2013-11-19 ENCOUNTER — Encounter: Payer: Self-pay | Admitting: Medical

## 2014-01-04 HISTORY — PX: OTHER SURGICAL HISTORY: SHX169

## 2014-02-01 ENCOUNTER — Emergency Department (HOSPITAL_COMMUNITY): Payer: BC Managed Care – PPO

## 2014-02-01 ENCOUNTER — Emergency Department (HOSPITAL_COMMUNITY)
Admission: EM | Admit: 2014-02-01 | Discharge: 2014-02-01 | Disposition: A | Discharge status: Home / Self Care | Payer: BC Managed Care – PPO | Attending: Emergency Medicine | Admitting: Emergency Medicine

## 2014-02-01 ENCOUNTER — Encounter (HOSPITAL_COMMUNITY): Payer: Self-pay

## 2014-02-01 DIAGNOSIS — Z79899 Other long term (current) drug therapy: Secondary | ICD-10-CM | POA: Insufficient documentation

## 2014-02-01 DIAGNOSIS — E669 Obesity, unspecified: Secondary | ICD-10-CM | POA: Insufficient documentation

## 2014-02-01 DIAGNOSIS — Z8719 Personal history of other diseases of the digestive system: Secondary | ICD-10-CM | POA: Insufficient documentation

## 2014-02-01 DIAGNOSIS — N83201 Unspecified ovarian cyst, right side: Secondary | ICD-10-CM

## 2014-02-01 DIAGNOSIS — G8929 Other chronic pain: Secondary | ICD-10-CM | POA: Insufficient documentation

## 2014-02-01 DIAGNOSIS — Z8701 Personal history of pneumonia (recurrent): Secondary | ICD-10-CM | POA: Diagnosis not present

## 2014-02-01 DIAGNOSIS — Z9889 Other specified postprocedural states: Secondary | ICD-10-CM | POA: Diagnosis not present

## 2014-02-01 DIAGNOSIS — Z9884 Bariatric surgery status: Secondary | ICD-10-CM | POA: Insufficient documentation

## 2014-02-01 DIAGNOSIS — N390 Urinary tract infection, site not specified: Secondary | ICD-10-CM | POA: Insufficient documentation

## 2014-02-01 DIAGNOSIS — Z8679 Personal history of other diseases of the circulatory system: Secondary | ICD-10-CM | POA: Insufficient documentation

## 2014-02-01 DIAGNOSIS — R109 Unspecified abdominal pain: Secondary | ICD-10-CM

## 2014-02-01 DIAGNOSIS — N8320 Unspecified ovarian cysts: Secondary | ICD-10-CM | POA: Diagnosis not present

## 2014-02-01 LAB — URINALYSIS, ROUTINE W REFLEX MICROSCOPIC
Glucose, UA: NEGATIVE mg/dL
Ketones, ur: 15 mg/dL — AB
Nitrite: POSITIVE — AB
Protein, ur: NEGATIVE mg/dL
Specific Gravity, Urine: 1.024 (ref 1.005–1.030)
UROBILINOGEN UA: 4 mg/dL — AB (ref 0.0–1.0)
pH: 5.5 (ref 5.0–8.0)

## 2014-02-01 LAB — COMPREHENSIVE METABOLIC PANEL
ALK PHOS: 61 U/L (ref 39–117)
ALT: 31 U/L (ref 0–35)
AST: 27 U/L (ref 0–37)
Albumin: 4.2 g/dL (ref 3.5–5.2)
Anion gap: 11 (ref 5–15)
BILIRUBIN TOTAL: 1.4 mg/dL — AB (ref 0.3–1.2)
BUN: 9 mg/dL (ref 6–23)
CALCIUM: 9.6 mg/dL (ref 8.4–10.5)
CO2: 24 mmol/L (ref 19–32)
Chloride: 103 mEq/L (ref 96–112)
Creatinine, Ser: 0.89 mg/dL (ref 0.50–1.10)
GFR calc Af Amer: 87 mL/min — ABNORMAL LOW (ref >90)
GFR, EST NON AFRICAN AMERICAN: 75 mL/min — AB (ref >90)
Glucose, Bld: 114 mg/dL — ABNORMAL HIGH (ref 70–99)
Potassium: 3.9 mmol/L (ref 3.5–5.1)
Sodium: 138 mmol/L (ref 135–145)
Total Protein: 8.2 g/dL (ref 6.0–8.3)

## 2014-02-01 LAB — CBC WITH DIFFERENTIAL/PLATELET
BASOS PCT: 0 % (ref 0–1)
Basophils Absolute: 0 10*3/uL (ref 0.0–0.1)
EOS ABS: 0.1 10*3/uL (ref 0.0–0.7)
Eosinophils Relative: 1 % (ref 0–5)
HCT: 41.3 % (ref 36.0–46.0)
Hemoglobin: 13.8 g/dL (ref 12.0–15.0)
LYMPHS PCT: 42 % (ref 12–46)
Lymphs Abs: 2.5 10*3/uL (ref 0.7–4.0)
MCH: 28.3 pg (ref 26.0–34.0)
MCHC: 33.4 g/dL (ref 30.0–36.0)
MCV: 84.8 fL (ref 78.0–100.0)
Monocytes Absolute: 0.4 10*3/uL (ref 0.1–1.0)
Monocytes Relative: 7 % (ref 3–12)
Neutro Abs: 2.9 10*3/uL (ref 1.7–7.7)
Neutrophils Relative %: 50 % (ref 43–77)
PLATELETS: 313 10*3/uL (ref 150–400)
RBC: 4.87 MIL/uL (ref 3.87–5.11)
RDW: 14.4 % (ref 11.5–15.5)
WBC: 5.8 10*3/uL (ref 4.0–10.5)

## 2014-02-01 LAB — LIPASE, BLOOD: LIPASE: 43 U/L (ref 11–59)

## 2014-02-01 LAB — URINE MICROSCOPIC-ADD ON

## 2014-02-01 LAB — I-STAT CG4 LACTIC ACID, ED: LACTIC ACID, VENOUS: 0.66 mmol/L (ref 0.5–2.2)

## 2014-02-01 MED ORDER — CEPHALEXIN 250 MG/5ML PO SUSR: 1000.0000 mg | Freq: Three times a day (TID) | ORAL | Status: DC

## 2014-02-01 MED ORDER — SODIUM CHLORIDE 0.9 % IV BOLUS (SEPSIS)
1000.0000 mL | INTRAVENOUS | Status: AC
  Administered 2014-02-01: 1000 mL via INTRAVENOUS

## 2014-02-01 MED ORDER — ONDANSETRON HCL 4 MG PO TABS: 4.0000 mg | ORAL_TABLET | Freq: Four times a day (QID) | ORAL | Status: DC

## 2014-02-01 MED ORDER — HYDROCODONE-HOMATROPINE 5-1.5 MG/5ML PO SYRP
5.0000 mL | ORAL_SOLUTION | ORAL | Status: AC
  Administered 2014-02-01: 5 mL via ORAL
  Filled 2014-02-01: qty 5

## 2014-02-01 MED ORDER — IOHEXOL 300 MG/ML  SOLN: 25.0000 mL | Freq: Once | INTRAMUSCULAR | Status: DC | PRN

## 2014-02-01 MED ORDER — DEXTROSE 5 % IV SOLN
1.0000 g | Freq: Once | INTRAVENOUS | Status: AC
  Administered 2014-02-01: 1 g via INTRAVENOUS
  Filled 2014-02-01: qty 10

## 2014-02-01 MED ORDER — SODIUM CHLORIDE 0.9 % IV BOLUS (SEPSIS)
1000.0000 mL | Freq: Once | INTRAVENOUS | Status: AC
Start: 2014-02-01 — End: 2014-02-01
  Administered 2014-02-01: 1000 mL via INTRAVENOUS

## 2014-02-01 MED ORDER — SODIUM CHLORIDE 0.9 % IV BOLUS (SEPSIS)
1000.0000 mL | Freq: Once | INTRAVENOUS | Status: AC
  Administered 2014-02-01: 1000 mL via INTRAVENOUS

## 2014-02-01 MED ORDER — SULFAMETHOXAZOLE-TRIMETHOPRIM 800-160 MG PO TABS: 1.0000 | ORAL_TABLET | Freq: Once | ORAL | Status: DC

## 2014-02-01 MED ORDER — HYDROCODONE-HOMATROPINE 5-1.5 MG/5ML PO SYRP: 5.0000 mL | ORAL_SOLUTION | Freq: Four times a day (QID) | ORAL | Status: DC | PRN

## 2014-02-01 MED ORDER — ONDANSETRON HCL 4 MG/2ML IJ SOLN
4.0000 mg | Freq: Once | INTRAMUSCULAR | Status: AC
  Administered 2014-02-01: 4 mg via INTRAVENOUS
  Filled 2014-02-01: qty 2

## 2014-02-01 MED ORDER — HYDROMORPHONE HCL 1 MG/ML IJ SOLN
1.0000 mg | Freq: Once | INTRAMUSCULAR | Status: AC
  Administered 2014-02-01: 1 mg via INTRAVENOUS
  Filled 2014-02-01: qty 1

## 2014-02-01 NOTE — ED Notes (Addendum)
Pt had gastric bypass in Dec.  Pt states abdominal pain.  No n/v. No fever.  No constipation.  No urinary symptoms.  Pt surgery at [facility].  Pt states they told her it was not related to her surgery.

## 2014-02-01 NOTE — ED Provider Notes (Signed)
7:14 PM BP improved after IVF and sitting up/standing up. Will d/c to home.   Clinical Impression 1. UTI (lower urinary tract infection)   2. Abdominal pain   3. Cyst of right ovary      Pamella Pert, MD 02/02/14 1513

## 2014-02-01 NOTE — ED Provider Notes (Signed)
CSN: [id number]     Arrival date & time 02/01/14  0388 History   First MD Initiated Contact with Patient 02/01/14 1018     Chief Complaint  Patient presents with  . Abdominal Pain     (Consider location/radiation/quality/duration/timing/severity/associated sxs/prior Treatment) The history is provided by the patient.  RANETTE LUCKADOO is a 49 y.o. female hx of obesity with recent gastric bypass a month ago done at [facility] here with abdominal pain. She was prescribed liquid hydrocodone afterwards. About 4 days ago he ran out of her hydrocodone liquid. She's been having more right sided abdominal pain since then. She called her surgeon yesterday, who told her that she is unlikely to have complications from the bypass surgery. She has no nausea vomiting or fever or constipation or urinary symptoms.     Past Medical History  Diagnosis Date  . Obesity   . Chronic venous insufficiency     2D ECHO EF 55-60%. Nl LFT's, Nl renal fxn. Refused conpression stockings.  . Chronic abdominal pain     Once a month, lasts 3-4 days, could have been related to IUD, now resolved.  Marland Kitchen GERD (gastroesophageal reflux disease)   . Spontaneous abortion 2003    at 3 months gestation  . Heme positive stool     Colonoscopy done, BE 12/2004, diverticulosis in sigmoid. EGD 12/2004 normal.  . Ovarian enlargement, left 01/2005    4.7/3 cm on CT  . Community acquired pneumonia 08/2013   Past Surgical History  Procedure Laterality Date  . Cesarean section    . Carpal tunnel release    . Gastric bypss     Family History  Problem Relation Age of Onset  . Kidney disease Mother   . Diabetes Mother   . Heart disease Mother     CHF  . Hypertension Mother   . Rheum arthritis Mother   . Heart disease Father     CHF  . Alcohol abuse Father   . Hypertension Father    History  Substance Use Topics  . Smoking status: Never Smoker   . Smokeless tobacco: Never Used  . Alcohol Use: No   OB History    Gravida Para Term  Preterm AB TAB SAB Ectopic Multiple Living   3 2 2  1  1   2      Review of Systems  Gastrointestinal: Positive for abdominal pain.  All other systems reviewed and are negative.     Allergies  Shellfish allergy and Tramadol  Home Medications   Prior to Admission medications   Medication Sig Start Date End Date Taking? Authorizing Provider  acetaminophen (TYLENOL) 160 MG/5ML liquid Take 15 mg/kg by mouth every 4 (four) hours as needed for pain (pain).   Yes Historical Provider, MD  HYDROcodone-homatropine (HYCODAN) 5-1.5 MG/5ML syrup Take 5 mLs by mouth every 6 (six) hours as needed for cough (pain).   Yes Historical Provider, MD  omeprazole (PRILOSEC) 20 MG capsule Take 20 mg by mouth daily.   Yes Historical Provider, MD  ursodiol (ACTIGALL) 300 MG capsule Take 300 mg by mouth 2 (two) times daily.   Yes Historical Provider, MD  HYDROcodone-acetaminophen (NORCO/VICODIN) 5-325 MG per tablet Take 1 tablet by mouth every 4 (four) hours as needed. 08/21/13   Tanna Furry, MD  ibuprofen (ADVIL,MOTRIN) 200 MG tablet Take 800 mg by mouth every 6 (six) hours as needed (pain).    Historical Provider, MD  naproxen (NAPROSYN) 500 MG tablet Take 1 tablet (500 mg total)  by mouth 2 (two) times daily. Patient not taking: Reported on 02/01/2014 08/21/13   Tanna Furry, MD  ondansetron (ZOFRAN) 4 MG tablet Take 1 tablet (4 mg total) by mouth every 6 (six) hours. Patient not taking: Reported on 02/01/2014 08/21/13   Tanna Furry, MD  terbinafine (LAMISIL) 250 MG tablet Take 250 mg by mouth daily.    Historical Provider, MD   BP 96/52 mmHg  Pulse 64  Temp(Src) 98.1 F (36.7 C) (Oral)  Resp 16  SpO2 100%  LMP 01/20/2014 (Approximate) Physical Exam  Constitutional: She is oriented to person, place, and time.  Uncomfortable, obese   HENT:  Head: Normocephalic.  Mouth/Throat: Oropharynx is clear and moist.  Eyes: Conjunctivae are normal. Pupils are equal, round, and reactive to light.  Neck: Normal range of  motion. Neck supple.  Cardiovascular: Normal rate, regular rhythm and normal heart sounds.   Pulmonary/Chest: Effort normal and breath sounds normal. No respiratory distress. She has no wheezes. She has no rales.  Abdominal: Soft.  Surgical scars healing well. + RLQ tenderness, no rebound   Musculoskeletal: Normal range of motion. She exhibits no edema or tenderness.  Neurological: She is alert and oriented to person, place, and time. No cranial nerve deficit. Coordination normal.  Skin: Skin is warm and dry.  Psychiatric: She has a normal mood and affect. Her behavior is normal. Judgment and thought content normal.  Nursing note and vitals reviewed.   ED Course  Procedures (including critical care time) Labs Review Labs Reviewed  COMPREHENSIVE METABOLIC PANEL - Abnormal; Notable for the following:    Glucose, Bld 114 (*)    Total Bilirubin 1.4 (*)    GFR calc non Af Amer 75 (*)    GFR calc Af Amer 87 (*)    All other components within normal limits  URINALYSIS, ROUTINE W REFLEX MICROSCOPIC - Abnormal; Notable for the following:    Color, Urine ORANGE (*)    APPearance CLOUDY (*)    Hgb urine dipstick LARGE (*)    Bilirubin Urine SMALL (*)    Ketones, ur 15 (*)    Urobilinogen, UA 4.0 (*)    Nitrite POSITIVE (*)    Leukocytes, UA SMALL (*)    All other components within normal limits  URINE MICROSCOPIC-ADD ON - Abnormal; Notable for the following:    Bacteria, UA MANY (*)    All other components within normal limits  CBC WITH DIFFERENTIAL  LIPASE, BLOOD    Imaging Review Ct Abdomen Pelvis Wo Contrast  02/01/2014   CLINICAL DATA:  Initial encounter for abdominal pain on the right side and mid abdomen for 4 days. Status post gastric bypass 1 month ago.  EXAM: CT ABDOMEN AND PELVIS WITHOUT CONTRAST  TECHNIQUE: Multidetector CT imaging of the abdomen and pelvis was performed following the standard protocol without IV contrast.  COMPARISON:  07/31/2013.  FINDINGS: Lower chest:   Unremarkable  Hepatobiliary: No focal abnormality in the liver on this study without intravenous contrast. No evidence for hepatomegaly. There is no evidence for gallstones, gallbladder wall thickening, or pericholecystic fluid. No intrahepatic or extrahepatic biliary dilation.  Pancreas: No focal mass lesion. No dilatation of the main duct. No intraparenchymal cyst. No peripancreatic edema.  Spleen: No splenomegaly. No focal mass lesion.  Adrenals/Urinary Tract: No adrenal nodule or mass. No stones are seen in either kidney. No hydronephrosis. No evidence for hydroureter bladder is decompressed.  Stomach/Bowel: Suture material in the stomach is compatible with reported history of gastric bypass. The  gastrojejunostomy appears patent. No small bowel wall thickening. No small bowel dilatation. Terminal ileum is normal. The appendix is normal. Diverticular changes are noted in the left colon without evidence of diverticulitis.  Vascular/Lymphatic: No abdominal aortic aneurysm. No gastrohepatic or hepatoduodenal ligament lymphadenopathy. No retroperitoneal lymphadenopathy. No evidence for pelvic sidewall lymphadenopathy.  Reproductive: IUD is visualized in the uterus. No left adnexal. 4.4 x 3.1 x 4.7 cm cystic lesion is seeing high in the right anatomic pelvis, cranial to the uterine fundus.  Other: No intraperitoneal free fluid  Musculoskeletal: Bone windows reveal no worrisome lytic or sclerotic osseous lesions.  IMPRESSION: 1. No evidence for bowel obstruction. Assessment of soft tissue limited by the uninfused nature this exam. 2. 4.7 cm lesion in the region of the right uterine fundus extending up into the upper right pelvis is likely arising from the right ovary given the course of the right gonadal vasculature. This cannot be fully characterized on today's study and would likely be quite difficult to visualize by ultrasound given the high location. Nonemergent outpatient MRI follow-up could be used to assess  stability and to further characterize, as clinically warranted.   Electronically Signed   By: Misty Stanley M.D.   On: 02/01/2014 12:11     EKG Interpretation None      MDM   Final diagnoses:  Abdominal pain    VONETTA FOULK is a 49 y.o. female here with R sided ab pain with recent gastric bypass. Will get labs, UA, CT ab/pel. Will give pain meds and reassess.   3:15 PM CT showed 4.7 cm lesion R uterine fundus, likely from R ovary. Pain free now, CT read mentioned that [place] unlikely to be helpful due to location of the lesion. I doubt ovarian torsion. UA + UTI. BP slightly low. Will give second bolus and ceftriaxone. Will given second bolus and likely dc on keflex.   3:45 PM Signed out to Dr. Aline Brochure to reassess after IVF. Anticipate dc if not hypotensive. Will add on lactate.     Wandra Arthurs, MD 02/01/14 [phone]

## 2014-02-01 NOTE — Discharge Instructions (Signed)
Stay hydrated.   Take zofran for nausea.   Take keflex three times daily for a week.   Take hydrocodone for pain.   You have ovarian cyst. You need to get MRI with your doctor.   See your gastric bypass surgeon.   Return to ER if you have severe pain, fever, vomiting.

## 2014-09-12 ENCOUNTER — Other Ambulatory Visit: Payer: Self-pay | Admitting: Family Medicine

## 2014-09-12 ENCOUNTER — Ambulatory Visit
Admission: RE | Admit: 2014-09-12 | Discharge: 2014-09-12 | Disposition: A | Discharge status: Home / Self Care | Payer: BC Managed Care – PPO | Source: Ambulatory Visit | Attending: Family Medicine | Admitting: Family Medicine

## 2014-09-12 DIAGNOSIS — M79605 Pain in left leg: Secondary | ICD-10-CM

## 2014-12-23 ENCOUNTER — Encounter (HOSPITAL_BASED_OUTPATIENT_CLINIC_OR_DEPARTMENT_OTHER): Payer: Self-pay | Admitting: *Deleted

## 2014-12-23 ENCOUNTER — Emergency Department (HOSPITAL_BASED_OUTPATIENT_CLINIC_OR_DEPARTMENT_OTHER): Payer: Worker's Compensation

## 2014-12-23 ENCOUNTER — Emergency Department (HOSPITAL_BASED_OUTPATIENT_CLINIC_OR_DEPARTMENT_OTHER)
Admission: EM | Admit: 2014-12-23 | Discharge: 2014-12-23 | Disposition: A | Discharge status: Home / Self Care | Payer: Worker's Compensation | Attending: Emergency Medicine | Admitting: Emergency Medicine

## 2014-12-23 DIAGNOSIS — K219 Gastro-esophageal reflux disease without esophagitis: Secondary | ICD-10-CM | POA: Insufficient documentation

## 2014-12-23 DIAGNOSIS — Z8701 Personal history of pneumonia (recurrent): Secondary | ICD-10-CM | POA: Diagnosis not present

## 2014-12-23 DIAGNOSIS — Y998 Other external cause status: Secondary | ICD-10-CM | POA: Diagnosis not present

## 2014-12-23 DIAGNOSIS — S199XXA Unspecified injury of neck, initial encounter: Secondary | ICD-10-CM | POA: Diagnosis present

## 2014-12-23 DIAGNOSIS — Y92410 Unspecified street and highway as the place of occurrence of the external cause: Secondary | ICD-10-CM | POA: Diagnosis not present

## 2014-12-23 DIAGNOSIS — Y9389 Activity, other specified: Secondary | ICD-10-CM | POA: Insufficient documentation

## 2014-12-23 DIAGNOSIS — Z8742 Personal history of other diseases of the female genital tract: Secondary | ICD-10-CM | POA: Diagnosis not present

## 2014-12-23 DIAGNOSIS — Z8679 Personal history of other diseases of the circulatory system: Secondary | ICD-10-CM | POA: Insufficient documentation

## 2014-12-23 DIAGNOSIS — S139XXA Sprain of joints and ligaments of unspecified parts of neck, initial encounter: Secondary | ICD-10-CM

## 2014-12-23 DIAGNOSIS — G8929 Other chronic pain: Secondary | ICD-10-CM | POA: Diagnosis not present

## 2014-12-23 DIAGNOSIS — E669 Obesity, unspecified: Secondary | ICD-10-CM | POA: Diagnosis not present

## 2014-12-23 MED ORDER — IBUPROFEN 600 MG PO TABS: 600.0000 mg | ORAL_TABLET | Freq: Four times a day (QID) | ORAL | Status: DC | PRN

## 2014-12-23 MED ORDER — DIAZEPAM 5 MG PO TABS: 5.0000 mg | ORAL_TABLET | Freq: Four times a day (QID) | ORAL | Status: DC | PRN

## 2014-12-23 MED ORDER — KETOROLAC TROMETHAMINE 30 MG/ML IJ SOLN
30.0000 mg | Freq: Once | INTRAMUSCULAR | Status: AC
Start: 2014-12-23 — End: 2014-12-23
  Administered 2014-12-23: 30 mg via INTRAMUSCULAR
  Filled 2014-12-23: qty 1

## 2014-12-23 NOTE — ED Notes (Signed)
ROM intact. Patient placed in gown for possible Xray

## 2014-12-23 NOTE — Discharge Instructions (Signed)
Your neck pain is likely secondary to sprain/strain of the muscles of your neck.  This also causes muscle cramping and spasm.  Take the medications as prescribed.  Follow up outpatient for reevaluation.  Muscle Cramps and Spasms Muscle cramps and spasms occur when a muscle or muscles tighten and you have no control over this tightening (involuntary muscle contraction). They are a common problem and can develop in any muscle. The most common place is in the calf muscles of the leg. Both muscle cramps and muscle spasms are involuntary muscle contractions, but they also have differences:   Muscle cramps are sporadic and painful. They may last a few seconds to a quarter of an hour. Muscle cramps are often more forceful and last longer than muscle spasms.  Muscle spasms may or may not be painful. They may also last just a few seconds or much longer. CAUSES  It is uncommon for cramps or spasms to be due to a serious underlying problem. In many cases, the cause of cramps or spasms is unknown. Some common causes are:   Overexertion.   Overuse from repetitive motions (doing the same thing over and over).   Remaining in a certain position for a long period of time.   Improper preparation, form, or technique while performing a sport or activity.   Dehydration.   Injury.   Side effects of some medicines.   Abnormally low levels of the salts and ions in your blood (electrolytes), especially potassium and calcium. This could happen if you are taking water pills (diuretics) or you are pregnant.  Some underlying medical problems can make it more likely to develop cramps or spasms. These include, but are not limited to:   Diabetes.   Parkinson disease.   Hormone disorders, such as thyroid problems.   Alcohol abuse.   Diseases specific to muscles, joints, and bones.   Blood vessel disease where not enough blood is getting to the muscles.  HOME CARE INSTRUCTIONS   Stay well  hydrated. Drink enough water and fluids to keep your urine clear or pale yellow.  It may be helpful to massage, stretch, and relax the affected muscle.  For tight or tense muscles, use a warm towel, heating pad, or hot shower water directed to the affected area.  If you are sore or have pain after a cramp or spasm, applying ice to the affected area may relieve discomfort.  Put ice in a plastic bag.  Place a towel between your skin and the bag.  Leave the ice on for 15-20 minutes, 03-04 times a day.  Medicines used to treat a known cause of cramps or spasms may help reduce their frequency or severity. Only take over-the-counter or prescription medicines as directed by your caregiver. SEEK MEDICAL CARE IF:  Your cramps or spasms get more severe, more frequent, or do not improve over time.  MAKE SURE YOU:   Understand these instructions.  Will watch your condition.  Will get help right away if you are not doing well or get worse.   This information is not intended to replace advice given to you by your health care provider. Make sure you discuss any questions you have with your health care provider.   Document Released: 06/26/2001 Document Revised: 05/01/2012 Document Reviewed: 12/22/2011 Elsevier Interactive Patient Education [employer].
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
## 2015-04-28 IMAGING — CT CT ABD-PELV W/O CM
2 of 4 series · 17 of 46 positions shown, 19 images · IV contrast (READICAT/WATER)
Comparison: CT abdomen pelvis of 02/27/2007

CLINICAL DATA: Right upper quadrant and right flank pain for 6
months, more frequent recently

EXAM:
CT ABDOMEN AND PELVIS WITHOUT CONTRAST
TECHNIQUE: Multidetector CT imaging of the abdomen and pelvis was performed
following the standard protocol without IV contrast.

[Series 2: abd/pelvis without · axial · non-contrast · 0.80mm/px · z∈[-452,-17]mm · 14 of 95 slices shown, 16 images]
[im 4/95  soft-tissue]
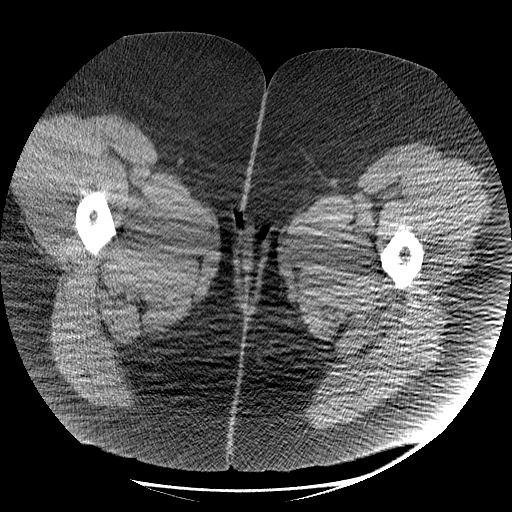
[im 4/95  bone]
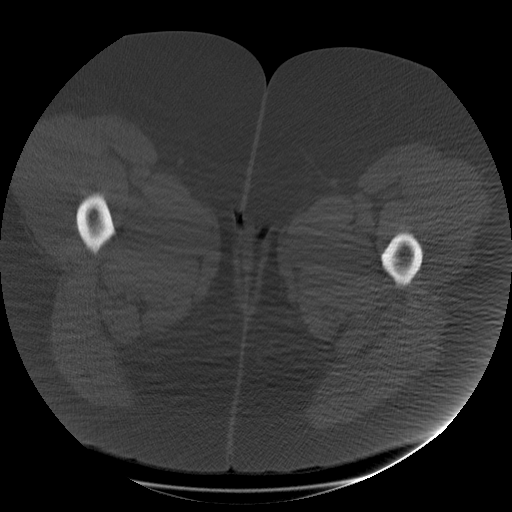
[im 12/95  soft-tissue]
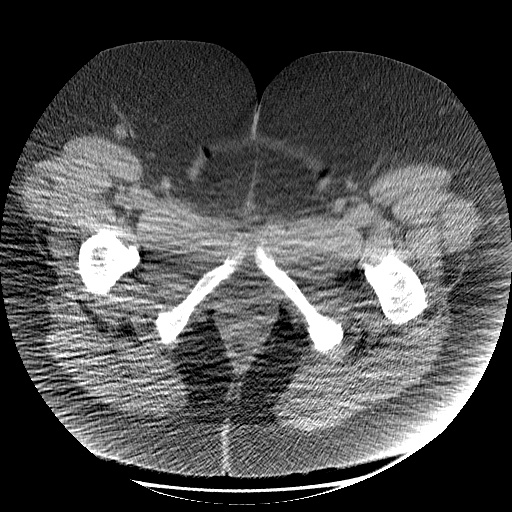
[im 19/95  soft-tissue]
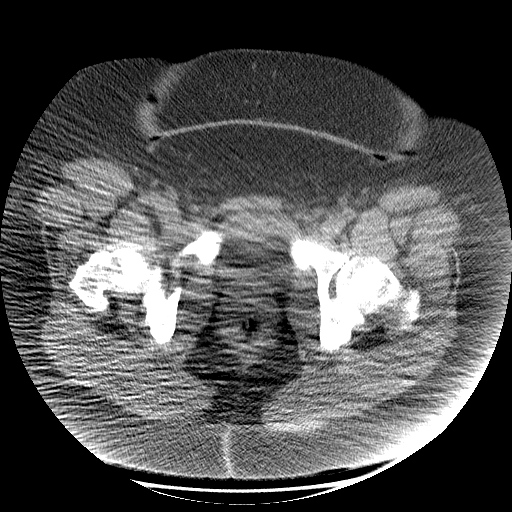
[im 27/95  soft-tissue]
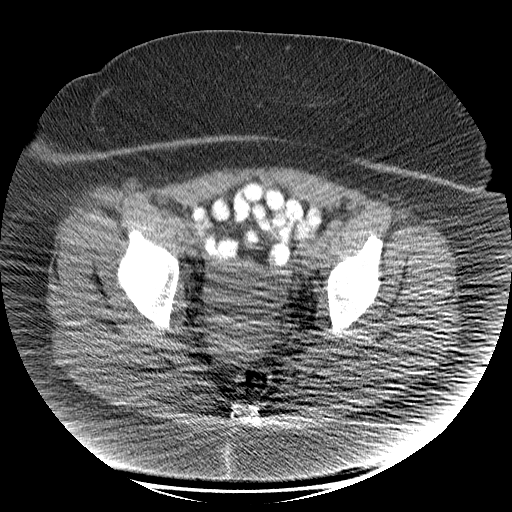
[im 31/95  soft-tissue]
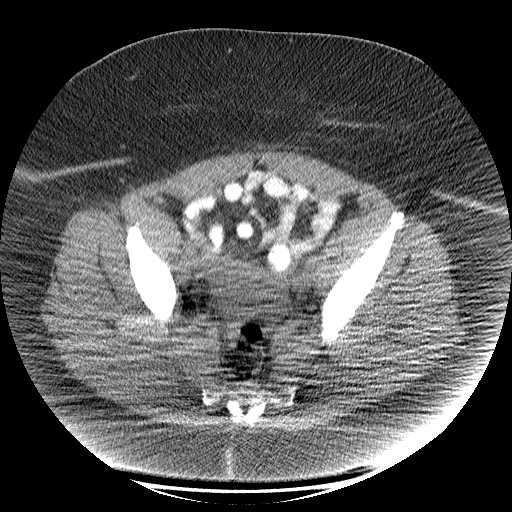
[im 38/95  soft-tissue]
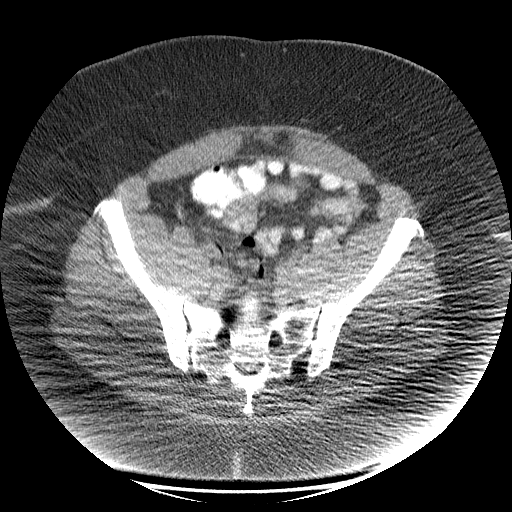
[im 46/95  soft-tissue]
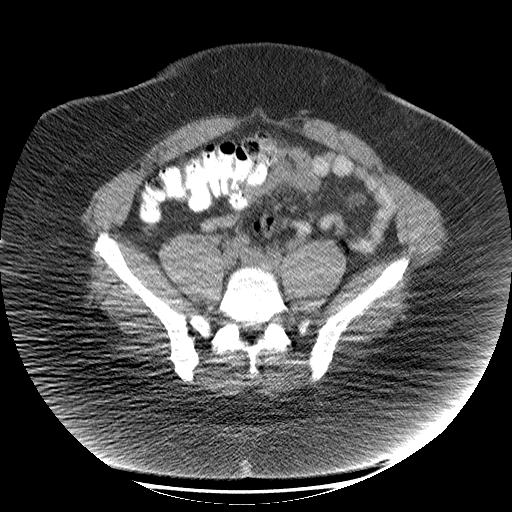
[im 49/95  soft-tissue]
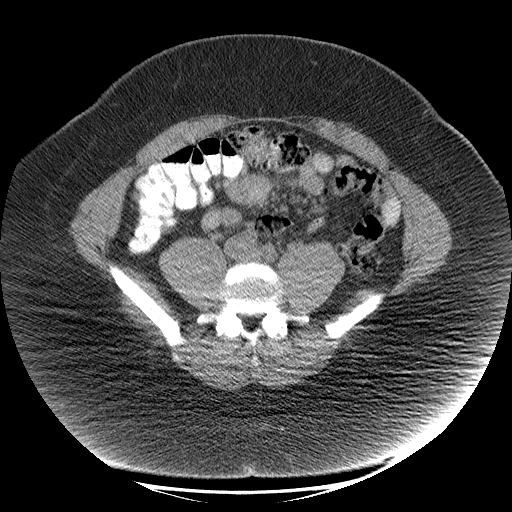
[im 57/95  soft-tissue]
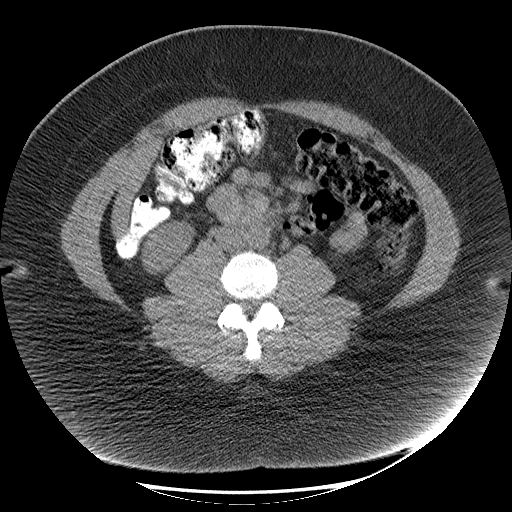
[im 57/95  bone]
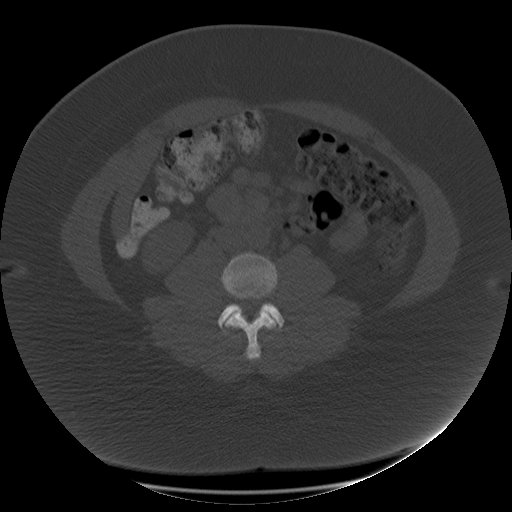
[im 64/95  soft-tissue]
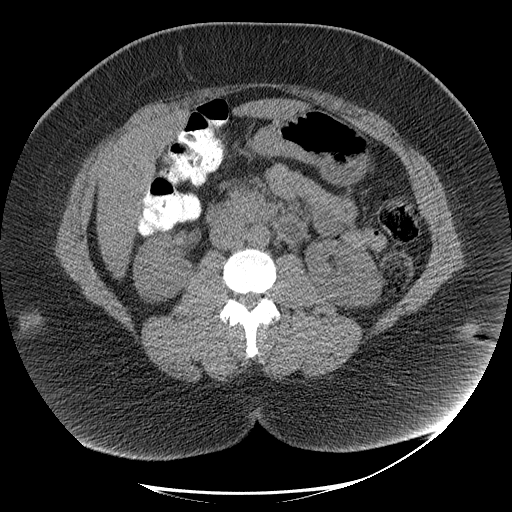
[im 72/95  soft-tissue]
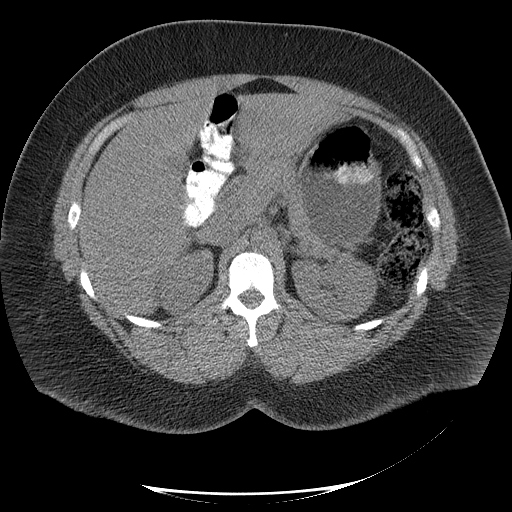
[im 76/95  soft-tissue]
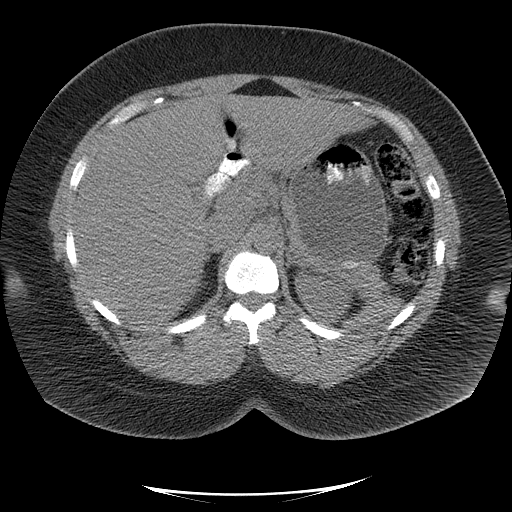
[im 83/95  soft-tissue]
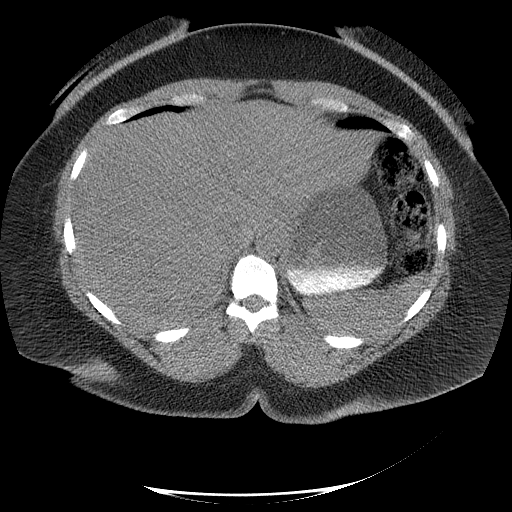
[im 91/95  soft-tissue]
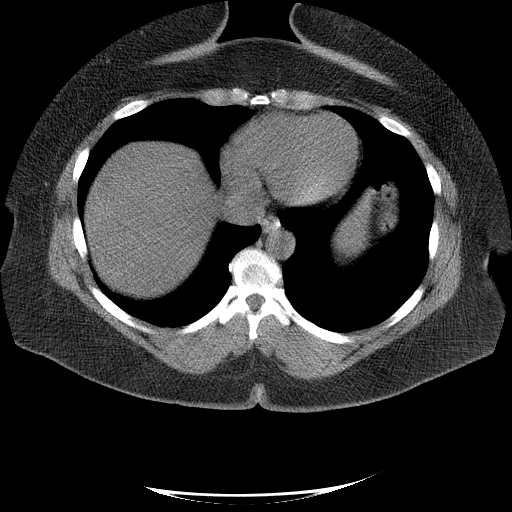

[Series 400: cor · coronal · 0.94mm/px · 3 of 191 slices shown]
[im 64/191  soft-tissue]
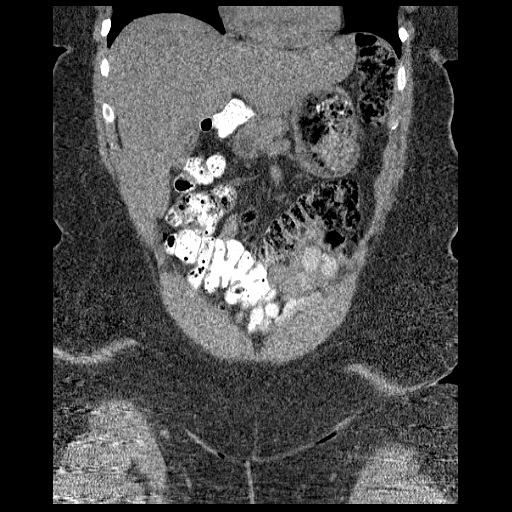
[im 85/191  soft-tissue]
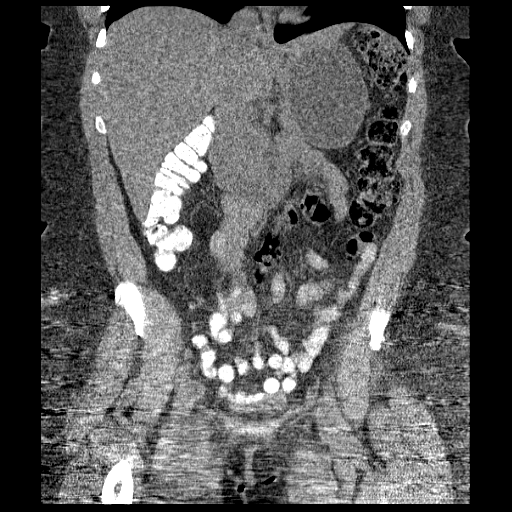
[im 106/191  soft-tissue]
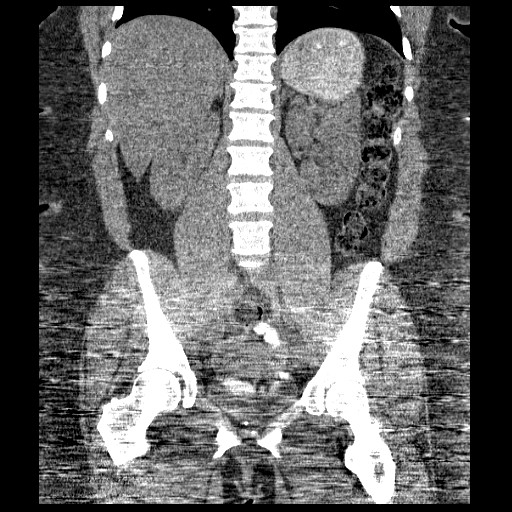

[17 of 46 positions shown; findings below may reference images not displayed]

FINDINGS: The lung bases are clear. On this unenhanced study, the liver is
unremarkable. No calcified gallstones are seen and the gallbladder
is not distended. The pancreas is normal in size, and the pancreatic
duct is not dilated. The adrenal glands and spleen are unremarkable.
The stomach is moderately fluid distended. No renal calculi are
noted and there is no evidence of hydronephrosis. The abdominal
aorta is normal in caliber. No adenopathy is seen.

Linear artifacts are noted within the pelvis due to the patient's
large body habitus. The urinary bladder is not well distended. The
uterus is unremarkable. No adnexal lesion is seen and no free fluid
is noted within the pelvis. The colon is largely decompressed. The
terminal ileum and the appendix are unremarkable. The lumbar
vertebrae are in normal alignment.
IMPRESSION: 1. No explanation for the patient's right-sided pain is seen. No
renal calculi are noted.
2. The appendix and terminal ileum are unremarkable.

## 2015-05-16 ENCOUNTER — Other Ambulatory Visit (HOSPITAL_COMMUNITY)
Admission: RE | Admit: 2015-05-16 | Discharge: 2015-05-16 | Disposition: A | Discharge status: Home / Self Care | Payer: BC Managed Care – PPO | Source: Ambulatory Visit | Attending: Family Medicine | Admitting: Family Medicine

## 2015-05-16 ENCOUNTER — Other Ambulatory Visit: Payer: Self-pay | Admitting: Family Medicine

## 2015-05-16 DIAGNOSIS — Z1151 Encounter for screening for human papillomavirus (HPV): Secondary | ICD-10-CM | POA: Diagnosis not present

## 2015-05-16 DIAGNOSIS — Z01411 Encounter for gynecological examination (general) (routine) with abnormal findings: Secondary | ICD-10-CM | POA: Diagnosis present

## 2015-05-19 LAB — CYTOLOGY - PAP

## 2015-07-27 IMAGING — CR DG KNEE COMPLETE 4+V*L*
4 series · 4 of 4 positions shown · non-contrast
Comparison: None.

CLINICAL DATA: Left knee pain.  Fall.  Dated injury:  03/30/2013

EXAM:
LEFT KNEE - COMPLETE 4+ VIEW

[view not recorded (1 of 4)]
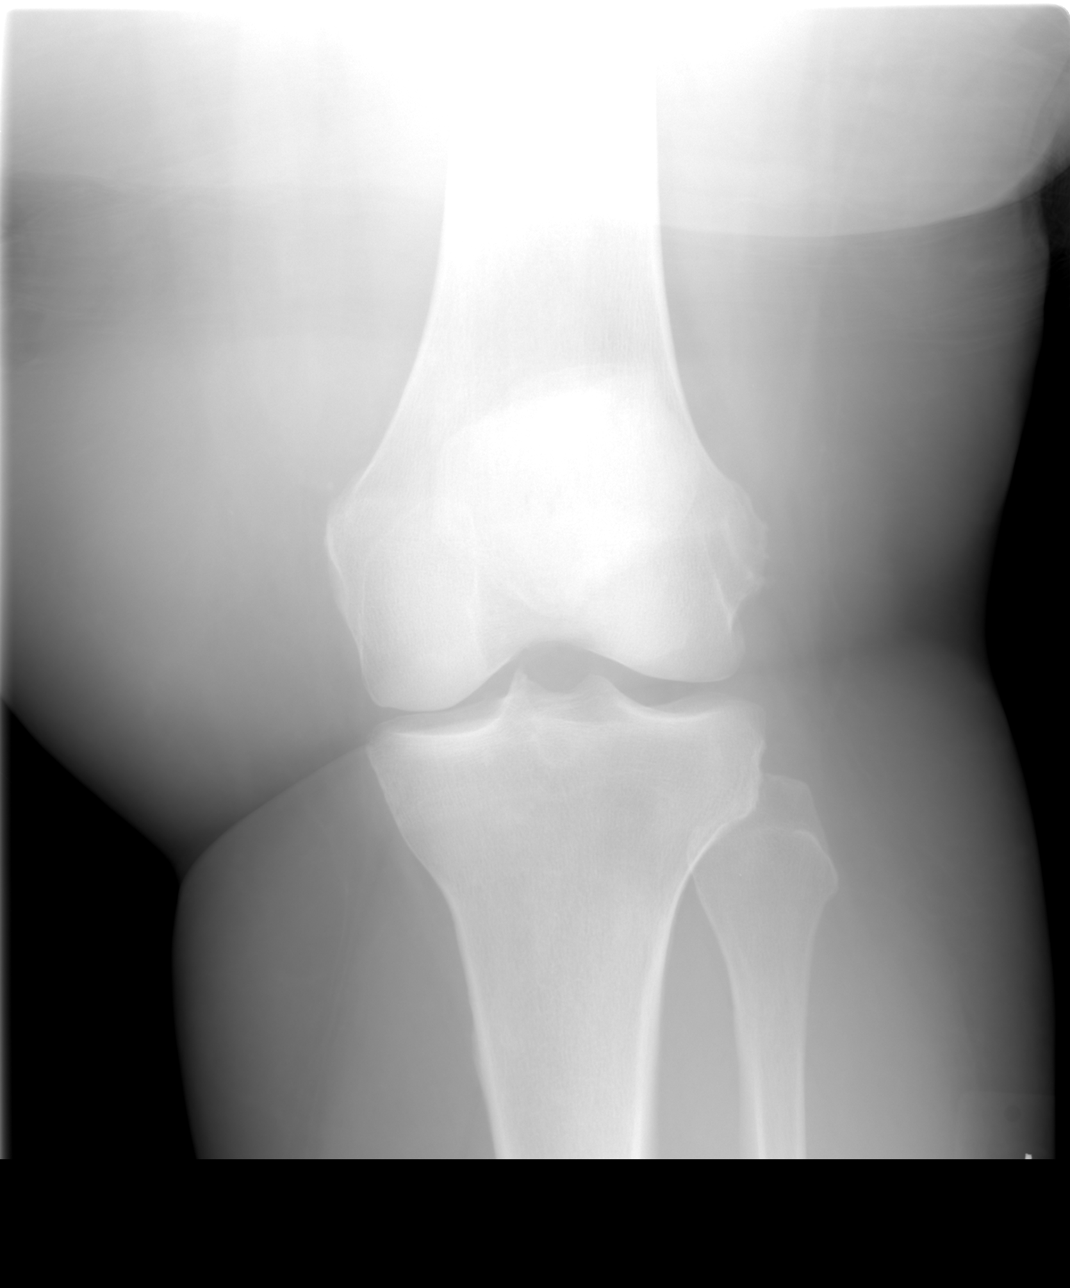

[view not recorded (2 of 4)]
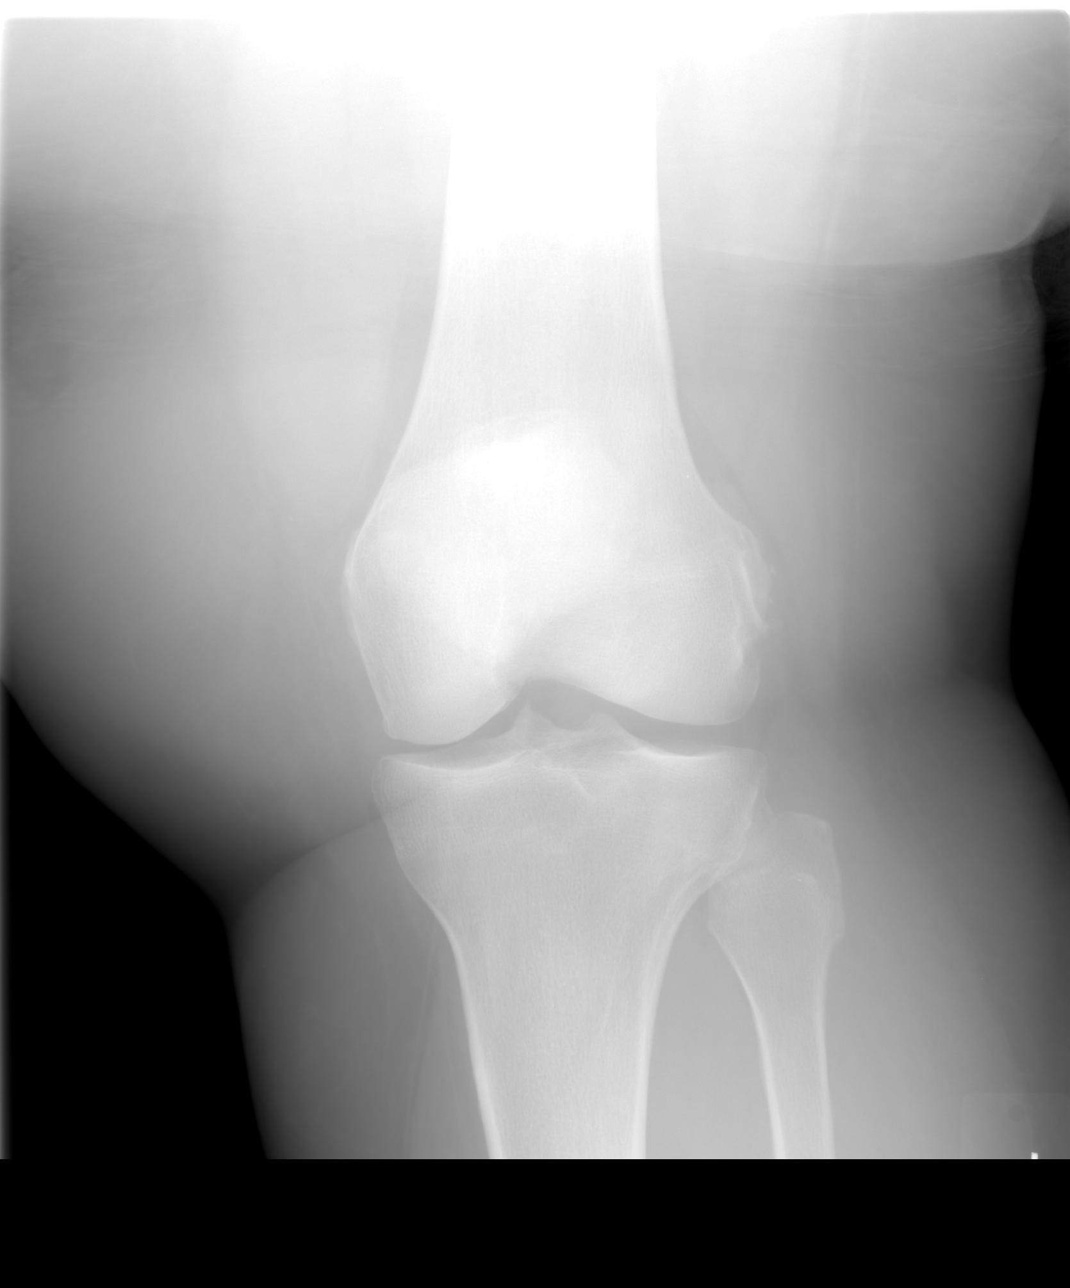

[view not recorded (3 of 4)]
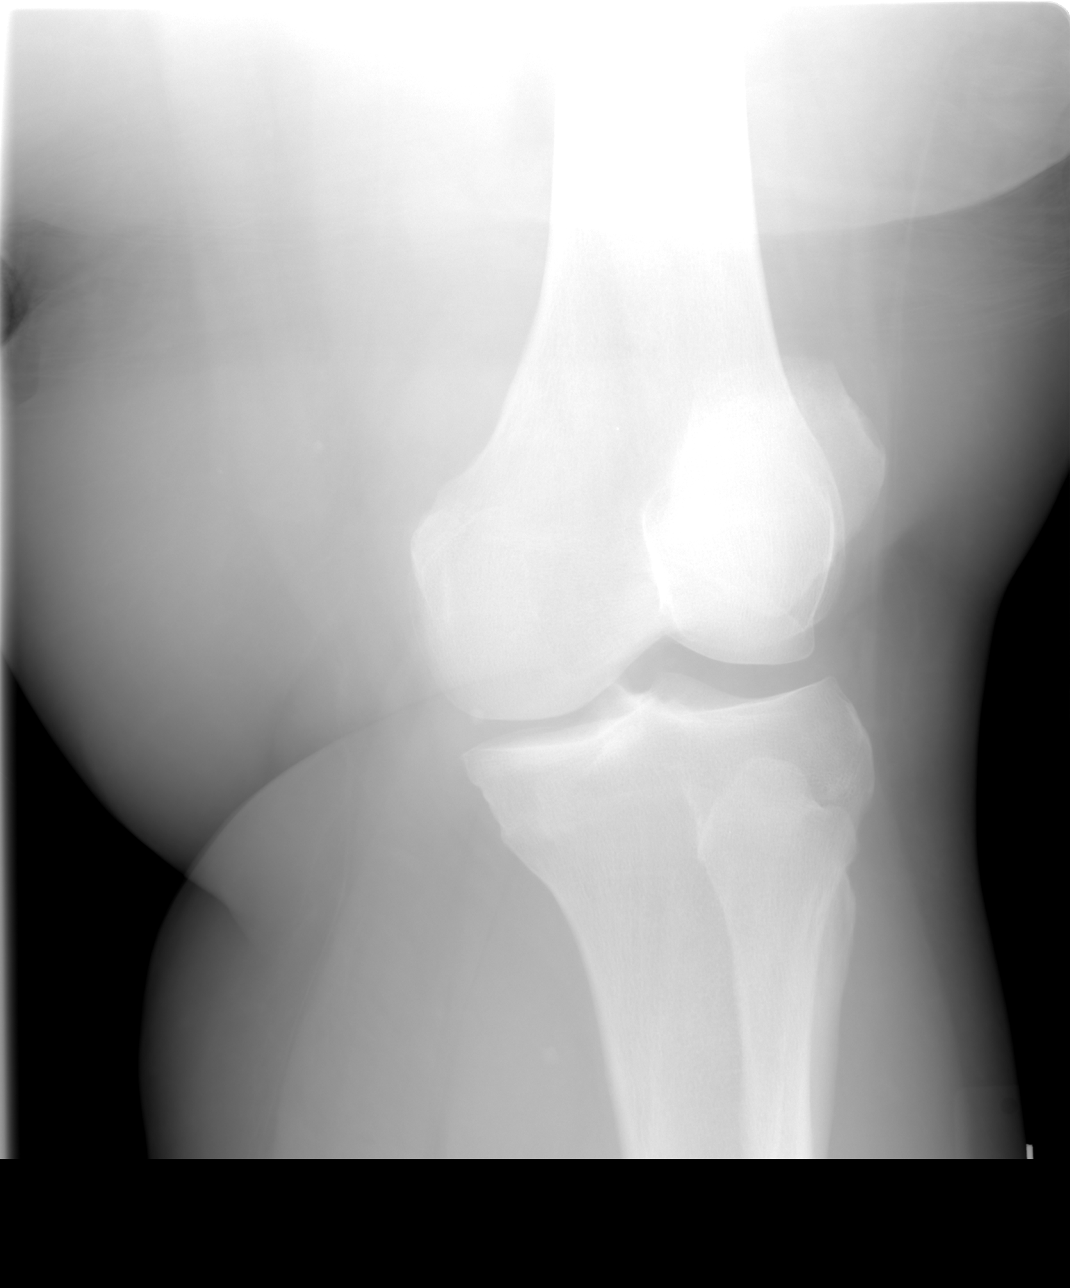

[view not recorded (4 of 4)]
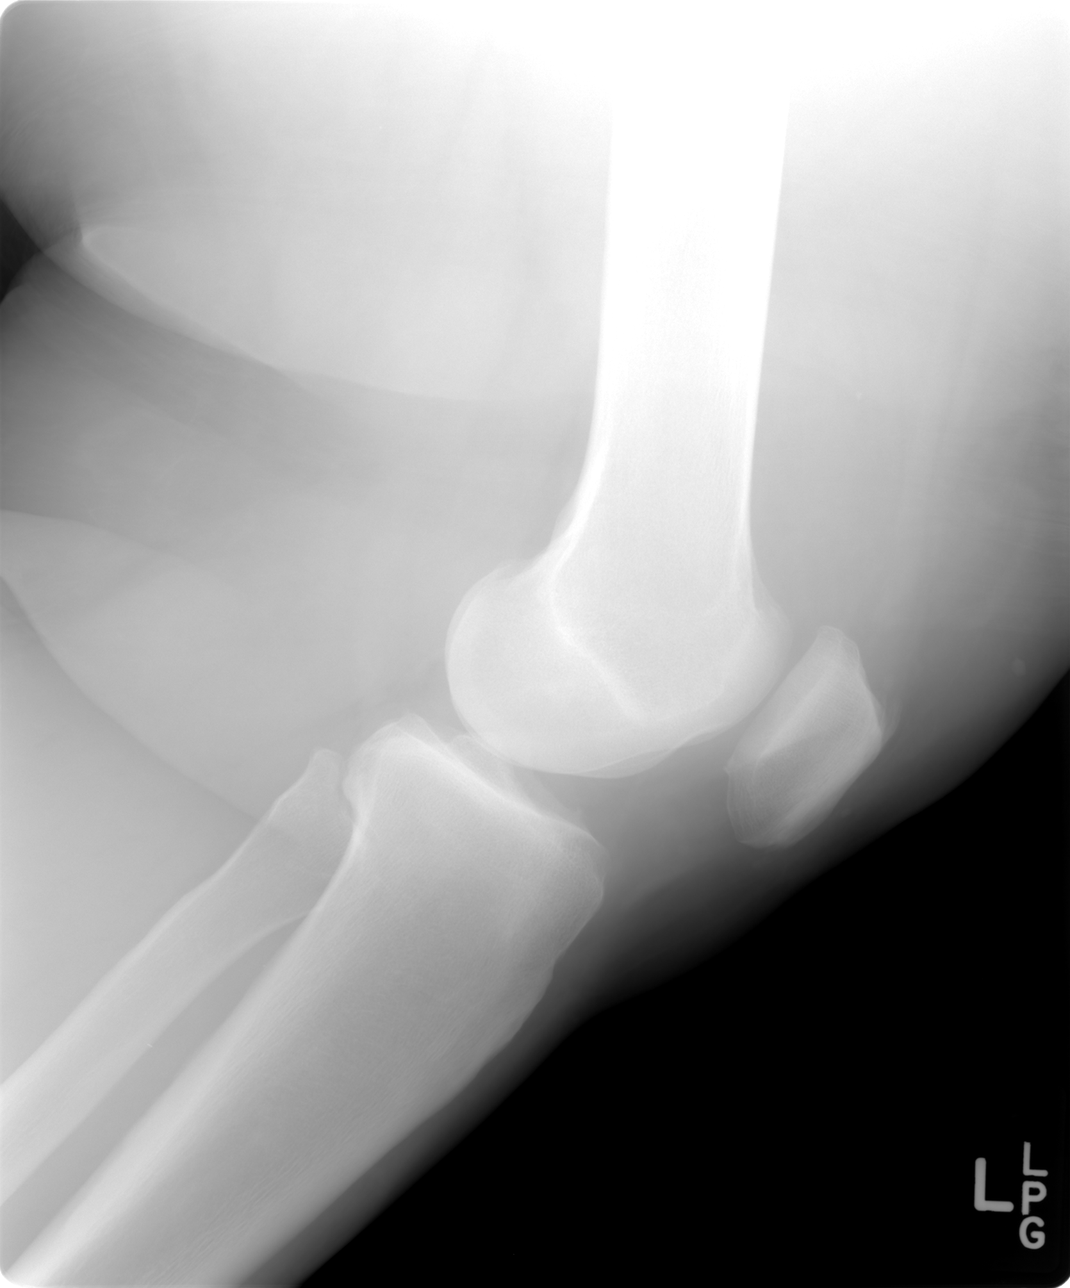

[4 of 4 positions shown; findings below may reference images not displayed]

FINDINGS: Mild medial compartmental articular space narrowing. Mild spurring
of the tibial spine. Mild patellar articular spurring and spurring
at the quadriceps insertion site.

Indeterminate for knee effusion given the generalized density over
the suprapatellar bursa.

No fracture identified.
IMPRESSION: 1. Mild degenerative arthropathy of the knee. Indeterminate for knee
effusion. No acute bony findings.

## 2015-10-06 ENCOUNTER — Other Ambulatory Visit: Payer: Self-pay | Admitting: Family Medicine

## 2015-10-06 DIAGNOSIS — Z1231 Encounter for screening mammogram for malignant neoplasm of breast: Secondary | ICD-10-CM

## 2015-10-13 ENCOUNTER — Ambulatory Visit
Admission: RE | Admit: 2015-10-13 | Discharge: 2015-10-13 | Disposition: A | Discharge status: Home / Self Care | Payer: BC Managed Care – PPO | Source: Ambulatory Visit | Attending: Family Medicine | Admitting: Family Medicine

## 2015-10-13 DIAGNOSIS — Z1231 Encounter for screening mammogram for malignant neoplasm of breast: Secondary | ICD-10-CM

## 2016-01-21 ENCOUNTER — Encounter: Payer: BC Managed Care – PPO | Attending: Family Medicine | Admitting: Dietician

## 2016-01-21 DIAGNOSIS — E669 Obesity, unspecified: Secondary | ICD-10-CM

## 2016-01-21 DIAGNOSIS — Z6839 Body mass index (BMI) 39.0-39.9, adult: Secondary | ICD-10-CM

## 2016-01-21 DIAGNOSIS — Z713 Dietary counseling and surveillance: Secondary | ICD-10-CM | POA: Diagnosis not present

## 2016-01-21 NOTE — Patient Instructions (Signed)
Try cutting out sugar free sweet drinks and sugar free desserts to see if it cuts back on sugar cravings. In a few weeks if you want to have a small amount of a "real sugar" dessert, have some and enjoy.  Continue with exercise program. Have snacks only if you are hungry. You are probably ok with 1 shake per day, unless you are substituting a shake for a meal with protein.

## 2016-01-21 NOTE — Progress Notes (Signed)
  Medical Nutrition Therapy:  Appt start time: 1120 end time:  1200.   Assessment:  Primary concerns today: Janet [name] is here today since she had an RYGB surgery in 12/2013 in [place]. Lost down to 220 lbs. Gained 20 lbs back in the past 6 months. 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 Before surgery highest weight was 363 lbs. Weight right before surgery was 327 lbs. Feels like surgery was "great" and has not had significant issues.   Would like to get weight down to 220 lbs. Has been trying to cut back on sweets in the past 6 weeks. Also started back to exercise.   Husband also had RYGB about 2.5 years ago and he is doing well. He runs 6 miles 4 x week. Lives with husband and three sons. Her husband does most of the food preparation at home. Does a lot of grilled and baked foods. Eats about about 1 x week and shares a meal. Likes bread and sugar. Does not have dumping syndrome regularly. Does not miss or skips meal. Sometimes has protein shakes for meals.   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 Takes bariatric fusion multivitamins 4 x day with calcium and iron.  Doesn't like meat as much as she used to. Not overally hungry since she eats regularly.   Preferred Learning Style:   No preference indicated   Learning Readiness:   Ready  MEDICATIONS: see list   DIETARY INTAKE:  Usual eating pattern includes 3 meals and 2-3 snacks per day.  Avoided foods include shrimp    24-hr recall:  B ( AM): Protein shake and small piece of banana and 1 strawberry and 2% milk  Snk ( AM): bacon and small amount of egg and cheese or other protein   L ( PM): salad with baked chicken or fish Snk ( PM): 2 cookies or donut or peanut butter crackers D ( PM): meat, vegetable, and salad or greens with pinto beans Snk ( PM): none or protein shake or sugar free desserts Beverages: 32 oz flavored water, coffee, 2 protein shakes (23 oz)   [facility] [facility]  walks track, bike, and elliptical for 40 minutes   Progress Towards Goal(s):  In progress.   Nutritional Diagnosis:  [place]-3.3 Overweight/obesity As related to excess consumption of sweets.  As evidenced by BMI of 39.3.    Intervention:  Nutrition counseling provided. Plan: Try cutting out sugar free sweet drinks and sugar free desserts to see if it cuts back on sugar cravings. In a few weeks if you want to have a small amount of a "real sugar" dessert, have some and enjoy.  Continue with exercise program. Have snacks only if you are hungry. You are probably ok with 1 shake per day, unless you are substituting a shake for a meal with protein.   Teaching Method Utilized:  Visual Auditory Hands on  Handouts given during visit include:  none  Barriers to learning/adherence to lifestyle change: none  Demonstrated degree of understanding via:  Teach Back   Monitoring/Evaluation:  Dietary intake, exercise, and body weight prn.

## 2016-03-07 ENCOUNTER — Emergency Department (HOSPITAL_BASED_OUTPATIENT_CLINIC_OR_DEPARTMENT_OTHER)
Admit: 2016-03-07 | Discharge: 2016-03-07 | Disposition: A | Discharge status: Home / Self Care | Payer: BC Managed Care – PPO

## 2016-03-07 ENCOUNTER — Encounter (HOSPITAL_COMMUNITY): Payer: Self-pay | Admitting: Emergency Medicine

## 2016-03-07 ENCOUNTER — Emergency Department (HOSPITAL_COMMUNITY): Payer: BC Managed Care – PPO

## 2016-03-07 ENCOUNTER — Emergency Department (HOSPITAL_COMMUNITY)
Admission: EM | Admit: 2016-03-07 | Discharge: 2016-03-07 | Disposition: A | Discharge status: Home / Self Care | Payer: BC Managed Care – PPO | Attending: Emergency Medicine | Admitting: Emergency Medicine

## 2016-03-07 ENCOUNTER — Other Ambulatory Visit: Payer: Self-pay

## 2016-03-07 DIAGNOSIS — M79609 Pain in unspecified limb: Secondary | ICD-10-CM | POA: Diagnosis not present

## 2016-03-07 DIAGNOSIS — M79604 Pain in right leg: Secondary | ICD-10-CM | POA: Insufficient documentation

## 2016-03-07 DIAGNOSIS — Z79899 Other long term (current) drug therapy: Secondary | ICD-10-CM | POA: Diagnosis not present

## 2016-03-07 DIAGNOSIS — M7989 Other specified soft tissue disorders: Secondary | ICD-10-CM | POA: Diagnosis not present

## 2016-03-07 LAB — COMPREHENSIVE METABOLIC PANEL
ALT: 20 U/L (ref 14–54)
AST: 21 U/L (ref 15–41)
Albumin: 3.7 g/dL (ref 3.5–5.0)
Alkaline Phosphatase: 58 U/L (ref 38–126)
Anion gap: 9 (ref 5–15)
BILIRUBIN TOTAL: 0.7 mg/dL (ref 0.3–1.2)
BUN: 19 mg/dL (ref 6–20)
CO2: 26 mmol/L (ref 22–32)
Calcium: 9.5 mg/dL (ref 8.9–10.3)
Chloride: 105 mmol/L (ref 101–111)
Creatinine, Ser: 0.9 mg/dL (ref 0.44–1.00)
GFR calc Af Amer: >60 mL/min (ref >60)
GFR calc non Af Amer: >60 mL/min (ref >60)
Glucose, Bld: 108 mg/dL — ABNORMAL HIGH (ref 65–99)
POTASSIUM: 3.9 mmol/L (ref 3.5–5.1)
Sodium: 140 mmol/L (ref 135–145)
Total Protein: 6.9 g/dL (ref 6.5–8.1)

## 2016-03-07 LAB — CBC WITH DIFFERENTIAL/PLATELET
Basophils Absolute: 0 10*3/uL (ref 0.0–0.1)
Basophils Relative: 0 %
EOS PCT: 2 %
Eosinophils Absolute: 0.1 10*3/uL (ref 0.0–0.7)
HCT: 37.7 % (ref 36.0–46.0)
Hemoglobin: 12.1 g/dL (ref 12.0–15.0)
Lymphocytes Relative: 34 %
Lymphs Abs: 2.5 10*3/uL (ref 0.7–4.0)
MCH: 27.6 pg (ref 26.0–34.0)
MCHC: 32.1 g/dL (ref 30.0–36.0)
MCV: 85.9 fL (ref 78.0–100.0)
Monocytes Absolute: 0.4 10*3/uL (ref 0.1–1.0)
Monocytes Relative: 6 %
Neutro Abs: 4.1 10*3/uL (ref 1.7–7.7)
Neutrophils Relative %: 58 %
PLATELETS: 283 10*3/uL (ref 150–400)
RBC: 4.39 MIL/uL (ref 3.87–5.11)
RDW: 13.4 % (ref 11.5–15.5)
WBC: 7.1 10*3/uL (ref 4.0–10.5)

## 2016-03-07 MED ORDER — IBUPROFEN 800 MG PO TABS
800.0000 mg | ORAL_TABLET | Freq: Once | ORAL | Status: AC
  Administered 2016-03-07: 800 mg via ORAL
  Filled 2016-03-07: qty 1

## 2016-03-07 NOTE — ED Notes (Signed)
ED Provider at bedside. 

## 2016-03-07 NOTE — ED Notes (Signed)
Ambulated pt in hallway. Pt states a little leg pain burt not bad. Pt has steady gait.

## 2016-03-07 NOTE — ED Provider Notes (Signed)
[facility] DEPT Provider Note   CSN: [id number] Arrival date & time: 03/07/16  0827     History   Chief Complaint Chief Complaint  Patient presents with  . Leg Pain    HPI Janet [name] is a 51 y.o. female.  HPI    Janet [name] is a 51 y.o. female, with a history of Chronic venous insufficiency, presenting to the ED with right lower leg pain beginning two days ago. 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 Pain is aching, 8/10, located "deep in the back of the calf," nonradiating. Initially, pain was 4/10. Pt has taken tylenol and vicodin without relief. Pain is worse with ambulation.   Also endorses intermittent chest pain over the last 2 weeks. She does not see a pattern as to when the pain comes on.  Pain is left chest, sharp, mild to moderate, will hurt for a day or two, nonradiating. Worse with palpation or when moving her left arm. Currently denies chest pain and states she has not had chest pain today. Patient adds that she moved furniture the day before this pain began.  Denies swelling, erythema, neuro deficits, known trauma, shortness of breath, cough, fever/chills, or any other complaints.    Denies history of DVT/PE, active cancer, tobacco use, recent hospitalization, recent immobilization, or recent surgery.       Past Medical History:  Diagnosis Date  . Chronic abdominal pain    Once a month, lasts 3-4 days, could have been related to IUD, now resolved.  . Chronic venous insufficiency    2D ECHO EF 55-60%. Nl LFT's, Nl renal fxn. Refused conpression stockings.  . Community acquired pneumonia 08/2013  . GERD (gastroesophageal reflux disease)   . Heme positive stool    Colonoscopy done, BE 12/2004, diverticulosis in sigmoid. EGD 12/2004 normal.  . Obesity   . Ovarian enlargement, left 01/2005   4.7/3 cm on CT  . Spontaneous abortion 2003   at 3 months gestation    Patient Active Problem List   Diagnosis Date Noted  . Morbid obesity  with BMI of 50.0-59.9, adult ([place]) 04/20/2012  . Acute bronchitis 07/16/2011  . Routine child health maintenance 06/29/2011  . Visit for TB skin test 06/29/2011  . ANKLE PAIN 03/02/2010  . 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 ONYCHOMYCOSIS 02/11/2010  . HYPERGLYCEMIA 02/06/2008  . HEEL PAIN, BILATERAL 08/01/2007  . DISEASE, NONINFLAMMATORY, OVARY/TUBE NOS 01/25/2006  . OBESITY NOS 11/17/2005  . INTERTRIGO, CANDIDAL 11/17/2005  . LEG EDEMA, BILATERAL 11/17/2005    Past Surgical History:  Procedure Laterality Date  . CARPAL TUNNEL RELEASE    . CESAREAN SECTION    . gastric bypss      OB History    Gravida Para Term Preterm AB Living   3 2 2   1 2    SAB TAB Ectopic Multiple Live Births   1               Home Medications    Prior to Admission medications   Medication Sig Start Date End Date Taking? Authorizing Provider  acetaminophen (TYLENOL) 160 MG/5ML liquid Take 15 mg/kg by mouth every 4 (four) hours as needed for pain (pain).    Historical Provider, MD  cephALEXin (KEFLEX) 250 MG/5ML suspension Take 20 mLs (1,000 mg total) by mouth 3 (three) times daily. 02/01/14   Drenda Freeze, MD  diazepam (VALIUM) 5 MG tablet Take 1 tablet (5 mg total) by  mouth every 6 (six) hours as needed for muscle spasms. 12/23/14   Harvel Quale, MD  HYDROcodone-acetaminophen (NORCO/VICODIN) 5-325 MG per tablet Take 1 tablet by mouth every 4 (four) hours as needed. 08/21/13   Tanna Furry, MD  HYDROcodone-homatropine [facility]) 5-1.5 MG/5ML syrup Take 5 mLs by mouth every 6 (six) hours as needed for cough (pain). 02/01/14   Drenda Freeze, MD  ibuprofen (ADVIL,MOTRIN) 600 MG tablet Take 1 tablet (600 mg total) by mouth every 6 (six) hours as needed for moderate pain. 12/23/14   Harvel Quale, MD  naproxen (NAPROSYN) 500 MG tablet Take 1 tablet (500 mg total) by mouth 2 (two) times daily. Patient not taking: Reported on 02/01/2014 08/21/13   Tanna Furry, MD  omeprazole (PRILOSEC) 20 MG  capsule Take 20 mg by mouth daily.    Historical Provider, MD  ondansetron (ZOFRAN) 4 MG tablet Take 1 tablet (4 mg total) by mouth every 6 (six) hours. 02/01/14   Drenda Freeze, MD  terbinafine (LAMISIL) 250 MG tablet Take 250 mg by mouth daily.    Historical Provider, MD  ursodiol (ACTIGALL) 300 MG capsule Take 300 mg by mouth 2 (two) times daily.    Historical Provider, MD    Family History Family History  Problem Relation Age of Onset  . Kidney disease Mother   . Diabetes Mother   . Heart disease Mother     CHF  . Hypertension Mother   . Rheum arthritis Mother   . Heart disease Father     CHF  . Alcohol abuse Father   . Hypertension Father     Social History Social History  Substance Use Topics  . Smoking status: Never Smoker  . Smokeless tobacco: Never Used  . Alcohol use No     Allergies   Shellfish allergy and Tramadol   Review of Systems Review of Systems  Constitutional: Negative for chills, diaphoresis and fever.  Respiratory: Negative for cough and shortness of breath.   Cardiovascular: Positive for chest pain (none currently).  Gastrointestinal: Negative for diarrhea, nausea and vomiting.  Musculoskeletal: Positive for myalgias. Negative for arthralgias, back pain and joint swelling.  Skin: Negative for color change.  All other systems reviewed and are negative.    Physical Exam Updated Vital Signs BP 92/56   Pulse 73   Temp 98.8 F (37.1 C) (Oral)   Resp 17   Ht 5' 5.5" (1.664 m)   Wt 109.8 kg   SpO2 100%   BMI 39.66 kg/m   Physical Exam  Constitutional: She appears well-developed and well-nourished. No distress.  HENT:  Head: Normocephalic and atraumatic.  Mouth/Throat: Oropharynx is clear and moist.  Eyes: Conjunctivae are normal.  Neck: Neck supple.  Cardiovascular: Normal rate, regular rhythm, normal heart sounds and intact distal pulses.   Pulmonary/Chest: Effort normal and breath sounds normal. No respiratory distress. She  exhibits tenderness (left).  Abdominal: Soft. There is no tenderness. There is no guarding.  Musculoskeletal: Normal range of motion. She exhibits tenderness. She exhibits no edema.  Slight tenderness over the posterior right lower leg. No noted swelling, erythema, or increased warmth.  Full ROM in the right ankle and knee.   Lymphadenopathy:    She has no cervical adenopathy.  Neurological: She is alert.  No sensory deficits. Strength 5/5 in bilateral lower extremities.   Skin: Skin is warm and dry. Capillary refill takes less than 2 seconds. She is not diaphoretic.  Psychiatric: She has a normal mood and  affect. Her behavior is normal.  Nursing note and vitals reviewed.    ED Treatments / Results  Labs (all labs ordered are listed, but only abnormal results are displayed) Labs Reviewed  COMPREHENSIVE METABOLIC PANEL - Abnormal; Notable for the following:       Result Value   Glucose, Bld 108 (*)    All other components within normal limits  D-DIMER, QUANTITATIVE (NOT AT [facility]) - Abnormal; Notable for the following:    D-Dimer, Quant <0.00 (*)    All other components within normal limits  CBC WITH DIFFERENTIAL/PLATELET    EKG  EKG Interpretation  Date/Time:  Sunday March 07 2016 10:23:53 EST Ventricular Rate:  60 PR Interval:    QRS Duration: 74 QT Interval:  690 QTC Calculation: 690 R Axis:   -8 Text Interpretation:  Sinus rhythm Low voltage, precordial leads Nonspecific T abnormalities, lateral leads Prolonged QT interval Abnormal ekg Confirmed by Carmin Muskrat  MD ([id number]) on 03/07/2016 10:29:09 AM       Radiology Dg Chest 2 View  Result Date: 03/07/2016 CLINICAL DATA:  Chest pain for months, back pain for 2 weeks, RIGHT lower leg pain since yesterday at midshaft medially radiating to RIGHT knee EXAM: CHEST  2 VIEW COMPARISON:  10/02/2013 FINDINGS: Normal heart size, mediastinal contours, and pulmonary vascularity. Lungs clear. No pleural effusion or pneumothorax.  Bones unremarkable. IMPRESSION: No acute abnormalities. Electronically Signed   By: Lavonia Dana M.D.   On: 03/07/2016 12:38   Dg Tibia/fibula Right  Result Date: 03/07/2016 CLINICAL DATA:  Chest pain for months, back pain for 2 weeks, RIGHT lower leg pain since yesterday at midshaft medially radiating to RIGHT knee EXAM: RIGHT TIBIA AND FIBULA - 2 VIEW COMPARISON:  None FINDINGS: Osseous mineralization normal. Joint spaces preserved. No acute fracture, dislocation, or bone destruction. Small plantar and Achilles insertion calcaneal spurs. IMPRESSION: Mild calcaneal spurring. No acute osseous abnormalities. Electronically Signed   By: Lavonia Dana M.D.   On: 03/07/2016 12:40    Procedures Procedures (including critical care time)  Medications Ordered in ED Medications  ibuprofen (ADVIL,MOTRIN) tablet 800 mg (800 mg Oral Given 03/07/16 1141)     Initial Impression / Assessment and Plan / ED Course  I have reviewed the triage vital signs and the nursing notes.  Pertinent labs & imaging results that were available during my care of the patient were reviewed by me and considered in my medical decision making (see chart for details).     Patient presents with right lower leg pain. Also complains of intermittent sharp chest pain. Suspicion for PE or DVT is low. Doubt ACS. D-dimer negative. Duplex ultrasound negative. X-rays negative. 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 The pain resolved while here in the ED. Patient able to ambulate without difficulty or assistance.  PCP follow-up. Home care and return precautions discussed. Patient voices understanding of all instructions and is comfortable with discharge.    Findings and plan of care discussed with Carmin Muskrat, MD.   Vitals:   03/07/16 [id number] 03/07/16 [id number] 03/07/16 0915 03/07/16 0919  BP:  107/61 92/56   Pulse:  77 73   Resp:  17    Temp:  98.8 F (37.1 C)      TempSrc:  Oral    SpO2:  100% 100% 100%  Weight: 109.8 kg     Height: 5' 5.5" (1.664 m)      Vitals:  03/07/16 0919 03/07/16 1015 03/07/16 1032 03/07/16 1140  BP:  103/65    Pulse:  85 73 84  Resp:   19   Temp:      TempSrc:      SpO2: 100% 98% 100% 100%  Weight:      Height:         Final Clinical Impressions(s) / ED Diagnoses   Final diagnoses:  Right leg pain    New Prescriptions Discharge Medication List as of 03/07/2016 12:51 PM       Lorayne Bender, PA-C 03/07/16 1317    Carmin Muskrat, MD 03/12/16 1216

## 2016-03-07 NOTE — Discharge Instructions (Signed)
You have been seen today for leg pain. There were no abnormalities noted on the labs or imaging, specifically, there was no signs of blood clot. The pain may be caused by inflammation due to overuse.  Rest the extremity for the next 3 days. Please keep the extremity elevated whenever possible. Tylenol for pain. Follow up with a primary care provider for any future management of this issue.

## 2016-03-07 NOTE — ED Triage Notes (Signed)
Pt. Stated, My rt. Leg from the knee down hurts since yesterday.

## 2016-03-07 NOTE — Progress Notes (Signed)
**  Preliminary report by tech**  Right lower extremity venous duplex complete. There is no evidence of deep or superficial vein thrombosis involving the right lower extremity. All visualized vessels appear patent and compressible. There is no evidence of a Baker's cyst on the right.  03/07/16 11:08 AM Carlos Levering RVT

## 2016-03-08 LAB — D-DIMER, QUANTITATIVE (NOT AT ARMC)

## 2016-03-30 ENCOUNTER — Other Ambulatory Visit: Payer: Self-pay | Admitting: Family Medicine

## 2016-03-30 DIAGNOSIS — M5416 Radiculopathy, lumbar region: Secondary | ICD-10-CM

## 2016-04-23 ENCOUNTER — Other Ambulatory Visit: Payer: Self-pay

## 2016-05-11 ENCOUNTER — Ambulatory Visit
Admission: RE | Admit: 2016-05-11 | Discharge: 2016-05-11 | Disposition: A | Discharge status: Home / Self Care | Payer: BC Managed Care – PPO | Source: Ambulatory Visit | Attending: Family Medicine | Admitting: Family Medicine

## 2016-05-11 DIAGNOSIS — M5416 Radiculopathy, lumbar region: Secondary | ICD-10-CM

## 2016-07-08 ENCOUNTER — Other Ambulatory Visit: Payer: Self-pay | Admitting: Gastroenterology

## 2016-07-08 DIAGNOSIS — Z1211 Encounter for screening for malignant neoplasm of colon: Secondary | ICD-10-CM

## 2016-07-08 DIAGNOSIS — Z800 Family history of malignant neoplasm of digestive organs: Secondary | ICD-10-CM

## 2016-07-26 ENCOUNTER — Other Ambulatory Visit: Payer: Self-pay

## 2016-07-28 ENCOUNTER — Ambulatory Visit
Admission: RE | Admit: 2016-07-28 | Discharge: 2016-07-28 | Disposition: A | Discharge status: Home / Self Care | Payer: BC Managed Care – PPO | Source: Ambulatory Visit | Attending: Gastroenterology | Admitting: Gastroenterology

## 2016-07-28 DIAGNOSIS — Z800 Family history of malignant neoplasm of digestive organs: Secondary | ICD-10-CM

## 2016-07-28 DIAGNOSIS — Z1211 Encounter for screening for malignant neoplasm of colon: Secondary | ICD-10-CM

## 2016-10-14 ENCOUNTER — Other Ambulatory Visit: Payer: Self-pay | Admitting: Family Medicine

## 2016-10-14 DIAGNOSIS — Z1231 Encounter for screening mammogram for malignant neoplasm of breast: Secondary | ICD-10-CM

## 2016-10-26 ENCOUNTER — Ambulatory Visit: Payer: Self-pay

## 2016-11-16 ENCOUNTER — Ambulatory Visit
Admission: RE | Admit: 2016-11-16 | Discharge: 2016-11-16 | Disposition: A | Discharge status: Home / Self Care | Payer: BC Managed Care – PPO | Source: Ambulatory Visit | Attending: Family Medicine | Admitting: Family Medicine

## 2016-11-16 DIAGNOSIS — Z1231 Encounter for screening mammogram for malignant neoplasm of breast: Secondary | ICD-10-CM

## 2017-10-12 ENCOUNTER — Ambulatory Visit: Payer: Self-pay | Admitting: Skilled Nursing Facility1

## 2017-10-19 ENCOUNTER — Encounter: Payer: Self-pay | Admitting: Skilled Nursing Facility1

## 2017-10-19 ENCOUNTER — Encounter: Payer: BC Managed Care – PPO | Attending: Family Medicine | Admitting: Skilled Nursing Facility1

## 2017-10-19 DIAGNOSIS — Z713 Dietary counseling and surveillance: Secondary | ICD-10-CM | POA: Diagnosis not present

## 2017-10-19 DIAGNOSIS — Z9884 Bariatric surgery status: Secondary | ICD-10-CM | POA: Diagnosis not present

## 2017-10-19 DIAGNOSIS — Z6841 Body Mass Index (BMI) 40.0 and over, adult: Secondary | ICD-10-CM

## 2017-10-19 NOTE — Progress Notes (Signed)
Primary concerns today: Post-operative Bariatric Surgery Nutrition Management.   Pt state she had the RYGB 2015. Pt states she does not take any medication. Pt states she just had extensive dental work. Pt states she sees her surgeon every year for her surgery and goes to support group every month. Pt states she is addicted to surgery and would rather have cake than chicken. Pt states she was as low as 227 pounds and is now 264.4 pounds. Pt states she is not beating herself up for her weight gain but would rather be 227 pounds. 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 Pt states he has been waking in the middle of the night hungry so she drinks a protein shake used to eat cookies and ice cream. Pt states she wakes to check on the house and use the restroom (3:15am). Pt states when she goes to bed at night she has an awful pain in her legs and aching legs. Pt states sometimes she gets really hot out of nowehere. Pt states sometimes she has lower abdominal pain stating it is relieved when she stand up and stretches. Pt states she has to pee all the time and sometimes cannot make it to the restroom. Pt states her husband has also had bariatric surgery.   TANITA  BODY COMP RESULTS  10/19/2017   BMI (kg/m^2) 43.3   Fat Mass (lbs) 124.6   Fat Free Mass (lbs) 139.8   Total Body Water (lbs) 102.6    24-hr recall: B (AM): premier protein shake or homemade shake: protein powder + 8 ounces 2% milk + 1 strawberry + 1/2 banana + 1/2 cup spinach or liver pudding with egg and coffee Snk (AM): cookies L (PM): tuna or chic fila chicken and fries Snk (PM): cookies  D (PM): rice or sweet potato and greens or spinach and chicken or fish Snk (PM): popcorn Waking in the middle of the night: protein shake  Fluid intake: coffee with splenda, sugar free cool aid, aloe drink, plain water:  Estimated total protein intake: 60+  Medications: none Supplementation: bariatric multivitamin   CBG monitoring:  Average CBG per patient:   Last patient reported A1c: 4.0  Using straws: no Drinking while eating: no Having you been chewing well:no Chewing/swallowing difficulties: no Changes in vision: no Changes to mood/headaches: no Hair loss/Cahnges to skin/Changes to nails: no Any difficulty focusing or concentrating: no Sweating: no Dizziness/Lightheaded:  Palpitations: no  Carbonated beverages: no N/V/D/C/GAS: no Abdominal Pain: no Dumping syndrome: no  Recent physical activity:  elliptical and treadmill 3 days a week 45 minutes   Progress Towards Goal(s):  In progress.   Nutritional Diagnosis:  [place]-3.3 Overweight/obesity related to past poor dietary habits and physical inactivity as evidenced by patient w/ recent RYGB surgery following dietary guidelines for continued weight loss.    Intervention:  Nutrition counseling.  Due to the bodies need for essential vitamins, minerals, and fats the pt was educated on the need to consume a certain amount of calories as well as certain nutrients daily. Pt was educated on the need for daily physical activity and to reach a goal of at least 150 minutes of moderate to vigorous physical activity as directed by their physician due to such benefits as increased musculature and improved lab values.   Goals: -Start checking your blood sugar: Once before you eat breakfast in the morning and once 2 hours after any meal: write down your numbers and what you ate; check your blood sugar after eating cookies at  least 2 times in the week -Aim to drink fluid 64 ounces a day: water, aloe drink, sugar free cool aid  -Do not eat cookies more than 2 times a WEEK -Aim to eat vegetables at least 2 times a day every day (7 days a week)  -Log everything you eat and drink and how much including everything you snack on -Aim to create balanced meals every time you eat  Teaching Method Utilized:  Visual Auditory Hands on  Barriers to learning/adherence to lifestyle change: none identified    Demonstrated degree of understanding via:  Teach Back   Monitoring/Evaluation:  Dietary intake, exercise, and body weight.

## 2017-10-19 NOTE — Patient Instructions (Addendum)
-  Start checking your blood sugar: Once before you eat breakfast in the morning and once 2 hours after any meal: write down your numbers and what you ate; check your blood sugar after eating cookies at least 2 times in the week  -Aim to drink fluid 64 ounces a day: water, aloe drink, sugar free cool aid   -Do not eat cookies more than 2 times a WEEK  -Aim to eat vegetables at least 2 times a day every day (7 days a week)   -Log everything you eat and drink and how much including everything you snack on  -Aim to create balanced meals every time you eat

## 2017-12-21 ENCOUNTER — Ambulatory Visit: Payer: Self-pay | Admitting: Skilled Nursing Facility1

## 2018-01-13 ENCOUNTER — Other Ambulatory Visit: Payer: Self-pay | Admitting: Family Medicine

## 2018-01-13 DIAGNOSIS — R1032 Left lower quadrant pain: Secondary | ICD-10-CM

## 2018-01-16 ENCOUNTER — Encounter (HOSPITAL_COMMUNITY): Payer: Self-pay | Admitting: Emergency Medicine

## 2018-01-16 ENCOUNTER — Ambulatory Visit
Admission: RE | Admit: 2018-01-16 | Discharge: 2018-01-16 | Disposition: A | Discharge status: Home / Self Care | Payer: BC Managed Care – PPO | Source: Ambulatory Visit | Attending: Family Medicine | Admitting: Family Medicine

## 2018-01-16 ENCOUNTER — Inpatient Hospital Stay (HOSPITAL_COMMUNITY)
Admission: EM | Admit: 2018-01-16 | Discharge: 2018-01-17 | DRG: 389 | Discharge status: Against Medical Advice | Payer: BC Managed Care – PPO | Attending: Internal Medicine | Admitting: Internal Medicine

## 2018-01-16 ENCOUNTER — Other Ambulatory Visit: Payer: Self-pay

## 2018-01-16 DIAGNOSIS — Z91013 Allergy to seafood: Secondary | ICD-10-CM

## 2018-01-16 DIAGNOSIS — K56609 Unspecified intestinal obstruction, unspecified as to partial versus complete obstruction: Secondary | ICD-10-CM | POA: Diagnosis not present

## 2018-01-16 DIAGNOSIS — K219 Gastro-esophageal reflux disease without esophagitis: Secondary | ICD-10-CM | POA: Diagnosis present

## 2018-01-16 DIAGNOSIS — R739 Hyperglycemia, unspecified: Secondary | ICD-10-CM | POA: Diagnosis present

## 2018-01-16 DIAGNOSIS — Z79891 Long term (current) use of opiate analgesic: Secondary | ICD-10-CM

## 2018-01-16 DIAGNOSIS — E669 Obesity, unspecified: Secondary | ICD-10-CM | POA: Diagnosis present

## 2018-01-16 DIAGNOSIS — I872 Venous insufficiency (chronic) (peripheral): Secondary | ICD-10-CM | POA: Diagnosis present

## 2018-01-16 DIAGNOSIS — R1032 Left lower quadrant pain: Secondary | ICD-10-CM

## 2018-01-16 DIAGNOSIS — Z9884 Bariatric surgery status: Secondary | ICD-10-CM

## 2018-01-16 DIAGNOSIS — Z79899 Other long term (current) drug therapy: Secondary | ICD-10-CM

## 2018-01-16 DIAGNOSIS — K56600 Partial intestinal obstruction, unspecified as to cause: Principal | ICD-10-CM | POA: Diagnosis present

## 2018-01-16 DIAGNOSIS — Z885 Allergy status to narcotic agent status: Secondary | ICD-10-CM

## 2018-01-16 DIAGNOSIS — Z6841 Body Mass Index (BMI) 40.0 and over, adult: Secondary | ICD-10-CM

## 2018-01-16 DIAGNOSIS — Z792 Long term (current) use of antibiotics: Secondary | ICD-10-CM

## 2018-01-16 MED ORDER — IOPAMIDOL (ISOVUE-300) INJECTION 61%
125.0000 mL | Freq: Once | INTRAVENOUS | Status: AC | PRN
  Administered 2018-01-16: 125 mL via INTRAVENOUS

## 2018-01-16 NOTE — ED Triage Notes (Signed)
Pt arrives to ED from home and was sent here today by her PCP for an abnormal CT scan of her abdomen. Pt reports her PCP told her that she has a blockage in her abdomen. Pt reports abdominal pains for 6 months now.

## 2018-01-17 ENCOUNTER — Encounter (HOSPITAL_COMMUNITY): Payer: Self-pay | Admitting: Internal Medicine

## 2018-01-17 DIAGNOSIS — Z792 Long term (current) use of antibiotics: Secondary | ICD-10-CM | POA: Diagnosis not present

## 2018-01-17 DIAGNOSIS — Z91013 Allergy to seafood: Secondary | ICD-10-CM | POA: Diagnosis not present

## 2018-01-17 DIAGNOSIS — K56609 Unspecified intestinal obstruction, unspecified as to partial versus complete obstruction: Secondary | ICD-10-CM | POA: Diagnosis not present

## 2018-01-17 DIAGNOSIS — Z6841 Body Mass Index (BMI) 40.0 and over, adult: Secondary | ICD-10-CM | POA: Diagnosis not present

## 2018-01-17 DIAGNOSIS — K56600 Partial intestinal obstruction, unspecified as to cause: Principal | ICD-10-CM

## 2018-01-17 DIAGNOSIS — I872 Venous insufficiency (chronic) (peripheral): Secondary | ICD-10-CM | POA: Diagnosis present

## 2018-01-17 DIAGNOSIS — Z9884 Bariatric surgery status: Secondary | ICD-10-CM | POA: Diagnosis not present

## 2018-01-17 DIAGNOSIS — K219 Gastro-esophageal reflux disease without esophagitis: Secondary | ICD-10-CM | POA: Diagnosis present

## 2018-01-17 DIAGNOSIS — Z79891 Long term (current) use of opiate analgesic: Secondary | ICD-10-CM | POA: Diagnosis not present

## 2018-01-17 DIAGNOSIS — Z79899 Other long term (current) drug therapy: Secondary | ICD-10-CM | POA: Diagnosis not present

## 2018-01-17 DIAGNOSIS — Z885 Allergy status to narcotic agent status: Secondary | ICD-10-CM | POA: Diagnosis not present

## 2018-01-17 DIAGNOSIS — E669 Obesity, unspecified: Secondary | ICD-10-CM | POA: Diagnosis present

## 2018-01-17 DIAGNOSIS — R739 Hyperglycemia, unspecified: Secondary | ICD-10-CM | POA: Diagnosis present

## 2018-01-17 LAB — HEPATIC FUNCTION PANEL
ALT: 21 U/L (ref 0–44)
AST: 23 U/L (ref 15–41)
Albumin: 3.6 g/dL (ref 3.5–5.0)
Alkaline Phosphatase: 57 U/L (ref 38–126)
BILIRUBIN INDIRECT: 0.3 mg/dL (ref 0.3–0.9)
Bilirubin, Direct: 0.1 mg/dL (ref 0.0–0.2)
Total Bilirubin: 0.4 mg/dL (ref 0.3–1.2)
Total Protein: 7.1 g/dL (ref 6.5–8.1)

## 2018-01-17 LAB — BASIC METABOLIC PANEL
ANION GAP: 7 (ref 5–15)
Anion gap: 11 (ref 5–15)
BUN: 13 mg/dL (ref 6–20)
BUN: 13 mg/dL (ref 6–20)
CALCIUM: 9.4 mg/dL (ref 8.9–10.3)
CO2: 24 mmol/L (ref 22–32)
CO2: 28 mmol/L (ref 22–32)
Calcium: 9.3 mg/dL (ref 8.9–10.3)
Chloride: 103 mmol/L (ref 98–111)
Chloride: 106 mmol/L (ref 98–111)
Creatinine, Ser: 0.85 mg/dL (ref 0.44–1.00)
Creatinine, Ser: 0.97 mg/dL (ref 0.44–1.00)
GFR calc Af Amer: >60 mL/min (ref >60)
GFR calc non Af Amer: >60 mL/min (ref >60)
Glucose, Bld: 118 mg/dL — ABNORMAL HIGH (ref 70–99)
Glucose, Bld: 92 mg/dL (ref 70–99)
Potassium: 3.9 mmol/L (ref 3.5–5.1)
Potassium: 4 mmol/L (ref 3.5–5.1)
SODIUM: 138 mmol/L (ref 135–145)
Sodium: 141 mmol/L (ref 135–145)

## 2018-01-17 LAB — CBC WITH DIFFERENTIAL/PLATELET
Abs Immature Granulocytes: 0.01 10*3/uL (ref 0.00–0.07)
Abs Immature Granulocytes: 0.02 10*3/uL (ref 0.00–0.07)
BASOS ABS: 0 10*3/uL (ref 0.0–0.1)
BASOS PCT: 0 %
BASOS PCT: 0 %
Basophils Absolute: 0 10*3/uL (ref 0.0–0.1)
EOS ABS: 0.1 10*3/uL (ref 0.0–0.5)
Eosinophils Absolute: 0.1 10*3/uL (ref 0.0–0.5)
Eosinophils Relative: 1 %
Eosinophils Relative: 2 %
HCT: 37.5 % (ref 36.0–46.0)
HCT: 38.3 % (ref 36.0–46.0)
Hemoglobin: 12.2 g/dL (ref 12.0–15.0)
Hemoglobin: 12.2 g/dL (ref 12.0–15.0)
IMMATURE GRANULOCYTES: 0 %
Immature Granulocytes: 0 %
LYMPHS ABS: 3.5 10*3/uL (ref 0.7–4.0)
Lymphocytes Relative: 47 %
Lymphocytes Relative: 48 %
Lymphs Abs: 2.9 10*3/uL (ref 0.7–4.0)
MCH: 27.5 pg (ref 26.0–34.0)
MCH: 28.2 pg (ref 26.0–34.0)
MCHC: 31.9 g/dL (ref 30.0–36.0)
MCHC: 32.5 g/dL (ref 30.0–36.0)
MCV: 86.3 fL (ref 80.0–100.0)
MCV: 86.6 fL (ref 80.0–100.0)
MONO ABS: 0.5 10*3/uL (ref 0.1–1.0)
Monocytes Absolute: 0.6 10*3/uL (ref 0.1–1.0)
Monocytes Relative: 7 %
Monocytes Relative: 8 %
NEUTROS PCT: 45 %
NRBC: 0 % (ref 0.0–0.2)
Neutro Abs: 2.5 10*3/uL (ref 1.7–7.7)
Neutro Abs: 3.4 10*3/uL (ref 1.7–7.7)
Neutrophils Relative %: 42 %
PLATELETS: 367 10*3/uL (ref 150–400)
Platelets: 336 10*3/uL (ref 150–400)
RBC: 4.33 MIL/uL (ref 3.87–5.11)
RBC: 4.44 MIL/uL (ref 3.87–5.11)
RDW: 13.6 % (ref 11.5–15.5)
RDW: 13.7 % (ref 11.5–15.5)
WBC: 6 10*3/uL (ref 4.0–10.5)
WBC: 7.5 10*3/uL (ref 4.0–10.5)
nRBC: 0 % (ref 0.0–0.2)

## 2018-01-17 LAB — I-STAT CG4 LACTIC ACID, ED
LACTIC ACID, VENOUS: 0.67 mmol/L (ref 0.5–1.9)
LACTIC ACID, VENOUS: 0.94 mmol/L (ref 0.5–1.9)

## 2018-01-17 LAB — HIV ANTIBODY (ROUTINE TESTING W REFLEX): HIV Screen 4th Generation wRfx: NONREACTIVE

## 2018-01-17 MED ORDER — ONDANSETRON HCL 4 MG/2ML IJ SOLN: 4.0000 mg | Freq: Four times a day (QID) | INTRAMUSCULAR | Status: DC | PRN

## 2018-01-17 MED ORDER — ACETAMINOPHEN 325 MG PO TABS: 650.0000 mg | ORAL_TABLET | Freq: Four times a day (QID) | ORAL | Status: DC | PRN

## 2018-01-17 MED ORDER — ACETAMINOPHEN 650 MG RE SUPP: 650.0000 mg | Freq: Four times a day (QID) | RECTAL | Status: DC | PRN

## 2018-01-17 MED ORDER — LACTATED RINGERS IV SOLN: INTRAVENOUS | Status: DC

## 2018-01-17 MED ORDER — ONDANSETRON HCL 4 MG PO TABS: 4.0000 mg | ORAL_TABLET | Freq: Four times a day (QID) | ORAL | Status: DC | PRN

## 2018-01-17 NOTE — ED Provider Notes (Signed)
Patient seen/examined in the Emergency Department in conjunction with Midlevel Provider [facility] Patient reports abdominal pain, sent by PCP For bowel obstruction Exam : awake/alert, appears uncomfortable, but no acute distress Plan: admit for SBO    Ripley Fraise, MD 01/17/18 [phone]

## 2018-01-17 NOTE — ED Notes (Signed)
Pt spoke with charge nurse, escorted out of department by this RN.

## 2018-01-17 NOTE — ED Notes (Signed)
Patient requesting number for patient experience, provided to patient. Pt requesting to speak to charge nurse prior to leaving, charge at bedside.

## 2018-01-17 NOTE — H&P (Signed)
History and Physical    Janet [name] JHE:[id number] DOB: 21-Nov-1965 DOA: 01/16/2018  PCP: Maurice Small, MD  Patient coming from: Home.  Chief Complaint: Abdominal pain.  HPI: Janet [name] is a 52 y.o. female with history of gastric bypass comes to the ER with complaints of increasing abdominal pain which is been ongoing for last few months.  Has been persistent.  Able to tolerate diet.  Pain is mostly in the upper abdomen.  Denies vomiting.  Last bowel movement 4 days ago.  ED Course: In the ER CAT scan of the abdomen shows possible early small bowel obstruction and general surgery was consulted and patient admitted for further management and observation.  Review of Systems: As per HPI, rest all negative.   Past Medical History:  Diagnosis Date  . Chronic abdominal pain    Once a month, lasts 3-4 days, could have been related to IUD, now resolved.  . Chronic venous insufficiency    2D ECHO EF 55-60%. Nl LFT's, Nl renal fxn. Refused conpression stockings.  . Community acquired pneumonia 08/2013  . GERD (gastroesophageal reflux disease)   . Heme positive stool    Colonoscopy done, BE 12/2004, diverticulosis in sigmoid. EGD 12/2004 normal.  . Obesity   . Ovarian enlargement, left 01/2005   4.7/3 cm on CT  . Spontaneous abortion 2003   at 3 months gestation    Past Surgical History:  Procedure Laterality Date  . CARPAL TUNNEL RELEASE    . CESAREAN SECTION    . gastric bypss       reports that she has never smoked. She has never used smokeless tobacco. She reports that she does not drink alcohol or use drugs.  Allergies  Allergen Reactions  . Shellfish Allergy Swelling  . Tramadol Itching and Nausea And Vomiting    Family History  Problem Relation Age of Onset  . Kidney disease Mother   . Diabetes Mother   . Heart disease Mother        CHF  . Hypertension Mother   . Rheum arthritis Mother   . Heart disease Father        CHF  . Alcohol abuse Father   .  Hypertension Father     Prior to Admission medications   Medication Sig Start Date End Date Taking? Authorizing Provider  cephALEXin (KEFLEX) 250 MG/5ML suspension Take 20 mLs (1,000 mg total) by mouth 3 (three) times daily. Patient not taking: Reported on 01/17/2018 02/01/14   Drenda Freeze, MD  diazepam (VALIUM) 5 MG tablet Take 1 tablet (5 mg total) by mouth every 6 (six) hours as needed for muscle spasms. Patient not taking: Reported on 01/17/2018 12/23/14   Harvel Quale, MD  HYDROcodone-acetaminophen (NORCO/VICODIN) 5-325 MG per tablet Take 1 tablet by mouth every 4 (four) hours as needed. Patient not taking: Reported on 01/17/2018 08/21/13   Tanna Furry, MD  HYDROcodone-homatropine [facility]) 5-1.5 MG/5ML syrup Take 5 mLs by mouth every 6 (six) hours as needed for cough (pain). Patient not taking: Reported on 01/17/2018 02/01/14   Drenda Freeze, MD  ibuprofen (ADVIL,MOTRIN) 600 MG tablet Take 1 tablet (600 mg total) by mouth every 6 (six) hours as needed for moderate pain. Patient not taking: Reported on 01/17/2018 12/23/14   Harvel Quale, MD  naproxen (NAPROSYN) 500 MG tablet Take 1 tablet (500 mg total) by mouth 2 (two) times daily. Patient not taking: Reported on 02/01/2014 08/21/13   Tanna Furry, MD  ondansetron [facility])  4 MG tablet Take 1 tablet (4 mg total) by mouth every 6 (six) hours. Patient not taking: Reported on 01/17/2018 02/01/14   Drenda Freeze, MD    Physical Exam: Vitals:   01/16/18 1955 01/16/18 2255 01/16/18 2349 01/16/18 2350  BP:  107/68 113/66   Pulse:  71 83   Resp:  16 (!) 27 17  Temp:      TempSrc:      SpO2:  100% 99%   Weight: 119.3 kg     Height: 5\' 5"  (1.651 m)         Constitutional: Moderately built and nourished.  Vitals:   01/16/18 1955 01/16/18 2255 01/16/18 2349 01/16/18 2350  BP:  107/68 113/66   Pulse:  71 83   Resp:  16 (!) 27 17  Temp:      TempSrc:      SpO2:  100% 99%   Weight: 119.3 kg     Height: 5\' 5"  (1.651  m)      Eyes: Anicteric no pallor. ENMT: No discharge from the ears eyes nose or mouth. Neck: No mass felt.  No neck rigidity. Respiratory: No rhonchi or crepitations. Cardiovascular: S1-S2 heard. Abdomen: Soft nontender bowel sounds appreciated but people no guarding or rigidity. Musculoskeletal: No edema. Skin: No rash. Neurologic: Alert awake oriented to time place and person.  Moves all extremities. Psychiatric: Appears normal.   Labs on Admission: I have personally reviewed following labs and imaging studies  CBC: Recent Labs  Lab 01/16/18 2353  WBC 7.5  NEUTROABS 3.4  HGB 12.2  HCT 37.5  MCV 86.6  PLT 347   Basic Metabolic Panel: Recent Labs  Lab 01/16/18 2353  NA 138  K 3.9  CL 103  CO2 28  GLUCOSE 118*  BUN 13  CREATININE 0.97  CALCIUM 9.4   GFR: Estimated Creatinine Clearance: 87.7 mL/min (by C-G formula based on SCr of 0.97 mg/dL). Liver Function Tests: No results for input(s): AST, ALT, ALKPHOS, BILITOT, PROT, ALBUMIN in the last 168 hours. No results for input(s): LIPASE, AMYLASE in the last 168 hours. No results for input(s): AMMONIA in the last 168 hours. Coagulation Profile: No results for input(s): INR, PROTIME in the last 168 hours. Cardiac Enzymes: No results for input(s): CKTOTAL, CKMB, CKMBINDEX, TROPONINI in the last 168 hours. BNP (last 3 results) No results for input(s): PROBNP in the last 8760 hours. HbA1C: No results for input(s): HGBA1C in the last 72 hours. CBG: No results for input(s): GLUCAP in the last 168 hours. Lipid Profile: No results for input(s): CHOL, HDL, LDLCALC, TRIG, CHOLHDL, LDLDIRECT in the last 72 hours. Thyroid Function Tests: No results for input(s): TSH, T4TOTAL, FREET4, T3FREE, THYROIDAB in the last 72 hours. Anemia Panel: No results for input(s): VITAMINB12, FOLATE, FERRITIN, TIBC, IRON, RETICCTPCT in the last 72 hours. Urine analysis:    Component Value Date/Time   COLORURINE ORANGE (A) 02/01/2014 1330     APPEARANCEUR CLOUDY (A) 02/01/2014 1330   LABSPEC 1.024 02/01/2014 1330   PHURINE 5.5 02/01/2014 1330   GLUCOSEU NEGATIVE 02/01/2014 1330   HGBUR LARGE (A) 02/01/2014 1330   BILIRUBINUR SMALL (A) 02/01/2014 1330   KETONESUR 15 (A) 02/01/2014 1330   PROTEINUR NEGATIVE 02/01/2014 1330   UROBILINOGEN 4.0 (H) 02/01/2014 1330   NITRITE POSITIVE (A) 02/01/2014 1330   LEUKOCYTESUR SMALL (A) 02/01/2014 1330   Sepsis Labs: @LABRCNTIP (procalcitonin:4,lacticidven:4) )No results found for this or any previous visit (from the past 240 hour(s)).   Radiological Exams on Admission:  Ct Abdomen Pelvis W Contrast  Result Date: 01/16/2018 CLINICAL DATA:  Abdominal pain.  History of gastric bypass procedure EXAM: CT ABDOMEN AND PELVIS WITH CONTRAST TECHNIQUE: Multidetector CT imaging of the abdomen and pelvis was performed using the standard protocol following bolus administration of intravenous contrast. Oral contrast was also administered. CONTRAST:  117mL ISOVUE-300 IOPAMIDOL (ISOVUE-300) INJECTION 61% COMPARISON:  July 28, 2016 FINDINGS: Lower chest: Lung bases are clear. Hepatobiliary: No focal liver lesions are appreciable. Gallbladder wall is not appreciably thickened. There is no biliary duct dilatation. Pancreas: No pancreatic mass or inflammatory focus. Spleen: No splenic lesions are evident. Adrenals/Urinary Tract: Adrenals bilaterally appear normal. Kidneys bilaterally show no evident mass or hydronephrosis on either side. There is no evident renal or ureteral calculus on either side. Urinary bladder is midline with wall thickness within normal limits. Stomach/Bowel: Patient has had gastric bypass grafting. In the area of previous surgery, there is no wall thickening or abnormal fluid. In the left upper abdomen, there is bowel wall thickening with a twisting type appearance, felt to represent a closed-loop type obstruction. Note that contrast has passed through this area, indicating incomplete  obstruction at this time. No other bowel wall thickening is seen. A well-defined transition zone is not appreciable. There is no evident free air or portal venous air. There is fairly diffuse stool throughout much of the colon. Vascular/Lymphatic: There is aortic atherosclerosis. No aneurysm evident. Major mesenteric vascular structures appear patent. There is no adenopathy in the abdomen or pelvis. Reproductive: Uterus is anteverted. Intrauterine device is positioned within the endometrium. No pelvic mass is evident. Other: Appendix appears normal. No abscess or ascites is evident in the abdomen or pelvis. Musculoskeletal: No blastic or lytic bone lesions. No intramuscular or abdominal wall lesions are evident. IMPRESSION: 1. Patient is status post gastric bypass procedure. In the upper abdomen slightly to the left of midline, there is wall thickening with an early swirl type pattern in the bowel in this area, a likely developing closed loop obstruction. Oral contrast has passed through this area indicating incomplete obstruction currently. No free air. Fairly diffuse stool in colon. 2. More distal bowel appears normal. No evident diverticulitis. No abscess in the abdomen or pelvis. Appendix appears normal. 3.  No evident renal or ureteral calculus.  No hydronephrosis. 4.  Aortic atherosclerosis. 5.  Intrauterine device positioned within the endometrium. Aortic Atherosclerosis (ICD10-I70.0). Critical Value/emergent results were called by telephone at the time of interpretation on 01/16/2018 at 5:08 pm to Dr. Orland Mustard, family medicine physician covering for Dr. Jonny Ruiz, who verbally acknowledged these results. Electronically Signed   By: Lowella Grip III M.D.   On: 01/16/2018 17:09     Assessment/Plan Principal Problem:   Bowel obstruction ([place])    1. Small bowel obstruction -appreciate general surgery consult.  Patient be kept n.p.o. and and observation.  Further recommendation per general  surgery. 2. Morbid obesity status post gastric bypass present BMI is around 43.7.  Will need nutrition counseling.   DVT prophylaxis: SCDs. Code Status: Full code. Family Communication: Discussed with patient. Disposition Plan: Home. Consults called: General surgery. Admission status: Inpatient.   Rise Patience MD [facility] Pager [phone].  If 7PM-7AM, please contact night-coverage [email or web address] Password [email or web address]  01/17/2018, 3:53 AM

## 2018-01-17 NOTE — Consult Note (Signed)
Reason for Consult: abdominal pain Referring Physician: Gean Birchwood  Janet [name] is an 52 y.o. female.  HPI: 52 yo female with multiple months of abdominal pain. Pain is left lower side. It comes and goes. She denies nausea or vomiting. She denies diarrhea or constipation. The pain doe snot radiate. It is not related to movement. This time the pain was worse and she wen to see her PCP. She underwent CT scan and was recommended to come fto the ER for evaluation.  Past Medical History:  Diagnosis Date  . Chronic abdominal pain    Once a month, lasts 3-4 days, could have been related to IUD, now resolved.  . Chronic venous insufficiency    2D ECHO EF 55-60%. Nl LFT's, Nl renal fxn. Refused conpression stockings.  . Community acquired pneumonia 08/2013  . GERD (gastroesophageal reflux disease)   . Heme positive stool    Colonoscopy done, BE 12/2004, diverticulosis in sigmoid. EGD 12/2004 normal.  . Obesity   . Ovarian enlargement, left 01/2005   4.7/3 cm on CT  . Spontaneous abortion 2003   at 3 months gestation    Past Surgical History:  Procedure Laterality Date  . CARPAL TUNNEL RELEASE    . CESAREAN SECTION    . gastric bypss      Family History  Problem Relation Age of Onset  . Kidney disease Mother   . Diabetes Mother   . Heart disease Mother        CHF  . Hypertension Mother   . Rheum arthritis Mother   . Heart disease Father        CHF  . Alcohol abuse Father   . Hypertension Father     Social History:  reports that she has never smoked. She has never used smokeless tobacco. She reports that she does not drink alcohol or use drugs.  Allergies:  Allergies  Allergen Reactions  . Shellfish Allergy Swelling  . Tramadol Itching and Nausea And Vomiting    Medications: I have reviewed the patient's current medications.  Results for orders placed or performed during the hospital encounter of 01/16/18 (from the past 48 hour(s))  CBC with Differential/Platelet      Status: None   Collection Time: 01/16/18 11:53 PM  Result Value Ref Range   WBC 7.5 4.0 - 10.5 K/uL   RBC 4.33 3.87 - 5.11 MIL/uL   Hemoglobin 12.2 12.0 - 15.0 g/dL   HCT 37.5 36.0 - 46.0 %   MCV 86.6 80.0 - 100.0 fL   MCH 28.2 26.0 - 34.0 pg   MCHC 32.5 30.0 - 36.0 g/dL   RDW 13.6 11.5 - 15.5 %   Platelets 367 150 - 400 K/uL   nRBC 0.0 0.0 - 0.2 %   Neutrophils Relative % 45 %   Neutro Abs 3.4 1.7 - 7.7 K/uL   Lymphocytes Relative 47 %   Lymphs Abs 3.5 0.7 - 4.0 K/uL   Monocytes Relative 7 %   Monocytes Absolute 0.6 0.1 - 1.0 K/uL   Eosinophils Relative 1 %   Eosinophils Absolute 0.1 0.0 - 0.5 K/uL   Basophils Relative 0 %   Basophils Absolute 0.0 0.0 - 0.1 K/uL   Immature Granulocytes 0 %   Abs Immature Granulocytes 0.01 0.00 - 0.07 K/uL    Comment: Performed at [facility], [address] [place], [place] [address]  Basic metabolic panel     Status: Abnormal   Collection Time: 01/16/18 11:53 PM  Result Value Ref Range   Sodium 138 135 - 145 mmol/L   Potassium 3.9 3.5 - 5.1 mmol/L   Chloride 103 98 - 111 mmol/L   CO2 28 22 - 32 mmol/L   Glucose, Bld 118 (H) 70 - 99 mg/dL   BUN 13 6 - 20 mg/dL   Creatinine, Ser 0.97 0.44 - 1.00 mg/dL   Calcium 9.4 8.9 - 10.3 mg/dL   GFR calc non Af Amer >60 >60 mL/min   GFR calc Af Amer >60 >60 mL/min   Anion gap 7 5 - 15    Comment: Performed at [facility] [address], [place], [place] [address]  I-Stat CG4 Lactic Acid, ED     Status: None   Collection Time: 01/17/18 12:23 AM  Result Value Ref Range   Lactic Acid, Venous 0.67 0.5 - 1.9 mmol/L  I-Stat CG4 Lactic Acid, ED     Status: None   Collection Time: 01/17/18  1:51 AM  Result Value Ref Range   Lactic Acid, Venous 0.94 0.5 - 1.9 mmol/L    Ct Abdomen Pelvis W Contrast  Result Date: 01/16/2018 CLINICAL DATA:  Abdominal pain.  History of gastric bypass procedure EXAM: CT ABDOMEN AND PELVIS WITH CONTRAST TECHNIQUE: Multidetector CT imaging of the abdomen  and pelvis was performed using the standard protocol following bolus administration of intravenous contrast. Oral contrast was also administered. CONTRAST:  147mL ISOVUE-300 IOPAMIDOL (ISOVUE-300) INJECTION 61% COMPARISON:  July 28, 2016 FINDINGS: Lower chest: Lung bases are clear. Hepatobiliary: No focal liver lesions are appreciable. Gallbladder wall is not appreciably thickened. There is no biliary duct dilatation. Pancreas: No pancreatic mass or inflammatory focus. Spleen: No splenic lesions are evident. Adrenals/Urinary Tract: Adrenals bilaterally appear normal. Kidneys bilaterally show no evident mass or hydronephrosis on either side. There is no evident renal or ureteral calculus on either side. Urinary bladder is midline with wall thickness within normal limits. Stomach/Bowel: Patient has had gastric bypass grafting. In the area of previous surgery, there is no wall thickening or abnormal fluid. In the left upper abdomen, there is bowel wall thickening with a twisting type appearance, felt to represent a closed-loop type obstruction. Note that contrast has passed through this area, indicating incomplete obstruction at this time. No other bowel wall thickening is seen. A well-defined transition zone is not appreciable. There is no evident free air or portal venous air. There is fairly diffuse stool throughout much of the colon. Vascular/Lymphatic: There is aortic atherosclerosis. No aneurysm evident. Major mesenteric vascular structures appear patent. There is no adenopathy in the abdomen or pelvis. Reproductive: Uterus is anteverted. Intrauterine device is positioned within the endometrium. No pelvic mass is evident. Other: Appendix appears normal. No abscess or ascites is evident in the abdomen or pelvis. Musculoskeletal: No blastic or lytic bone lesions. No intramuscular or abdominal wall lesions are evident. IMPRESSION: 1. Patient is status post gastric bypass procedure. In the upper abdomen slightly to  the left of midline, there is wall thickening with an early swirl type pattern in the bowel in this area, a likely developing closed loop obstruction. Oral contrast has passed through this area indicating incomplete obstruction currently. No free air. Fairly diffuse stool in colon. 2. More distal bowel appears normal. No evident diverticulitis. No abscess in the abdomen or pelvis. Appendix appears normal. 3.  No evident renal or ureteral calculus.  No hydronephrosis. 4.  Aortic atherosclerosis. 5.  Intrauterine device positioned within the endometrium. Aortic Atherosclerosis (ICD10-I70.0). Critical Value/emergent results  were called by telephone at the time of interpretation on 01/16/2018 at 5:08 pm to Dr. Orland Mustard, family medicine physician covering for Dr. Jonny Ruiz, who verbally acknowledged these results. Electronically Signed   By: Lowella Grip III M.D.   On: 01/16/2018 17:09    Review of Systems  Constitutional: Negative for chills and fever.  HENT: Negative for hearing loss.   Eyes: Negative for blurred vision and double vision.  Respiratory: Negative for cough and hemoptysis.   Cardiovascular: Negative for chest pain and palpitations.  Gastrointestinal: Positive for abdominal pain. Negative for nausea and vomiting.  Genitourinary: Negative for dysuria and urgency.  Musculoskeletal: Negative for myalgias and neck pain.  Skin: Negative for itching and rash.  Neurological: Negative for dizziness, tingling and headaches.  Endo/Heme/Allergies: Does not bruise/bleed easily.  Psychiatric/Behavioral: Negative for depression and suicidal ideas.   Blood pressure 113/66, pulse 83, temperature 99.1 F (37.3 C), temperature source Oral, resp. rate 17, height 5\' 5"  (1.651 m), weight 119.3 kg, SpO2 99 %. Physical Exam  Vitals reviewed. Constitutional: She is oriented to person, place, and time. She appears well-developed and well-nourished.  HENT:  Head: Normocephalic and atraumatic.  Eyes:  Pupils are equal, round, and reactive to light. Conjunctivae and EOM are normal.  Neck: Normal range of motion. Neck supple.  Cardiovascular: Normal rate and regular rhythm.  Respiratory: Effort normal and breath sounds normal.  GI: Soft. Bowel sounds are normal. She exhibits no distension. There is abdominal tenderness in the left lower quadrant. There is no guarding and no tenderness at McBurney's point.  Musculoskeletal: Normal range of motion.  Neurological: She is alert and oriented to person, place, and time.  Skin: Skin is warm and dry.  Psychiatric: She has a normal mood and affect. Her behavior is normal.    Assessment/Plan: 52 yo female s/p gastric bypass in 2015 presents with multiple months of intermittent abdominal pain. CT scan with one area of thickened intestine with concerning for swirling. No leukocytosis. No peritoneal signs. Pain is in left lower pelvic area and left abdomen is nontender to palpation. -bowel rest tonight -continue to observe  Arta Bruce Kinsinger 01/17/2018, 3:19 AM

## 2018-01-17 NOTE — Discharge Summary (Signed)
Physician Discharge Summary  Janet [name] KNL:[id number] DOB: 01/28/1965 DOA: 01/16/2018  PCP: Maurice Small, MD  Admit date: 01/16/2018 Discharge date: 01/17/2018  Admitted From: Home Disposition: Left AMA  Recommendations for Outpatient Follow-up:  1. Follow up with PCP in 1-2 weeks 2. Please obtain CMP/CBC, Mag, Phos in one weeks:  Home Health: No Equipment/Devices: None    Discharge Condition: Guarded  CODE STATUS: FULL CODE  Diet recommendation: NPO  Brief/Interim Summary: HPI per Dr. Gean Birchwood on 01/17/18 Janet [name] is a 52 y.o. female with history of gastric bypass comes to the ER with complaints of increasing abdominal pain which is been ongoing for last few months.  Has been persistent.  Able to tolerate diet.  Pain is mostly in the upper abdomen.  Denies vomiting.  Last bowel movement 4 days ago.  ED Course: In the ER CAT scan of the abdomen shows possible early small bowel obstruction and general surgery was consulted and patient admitted for further management and observation.  **No surgery Dr. Kieth Brightly was consulted for further evaluation recommendations recommend bowel rest and continued observation.  However patient decided she wanted to leave [place] and go to [facility].  She is of sound mind when she made his decisions and she left AGAINST MEDICAL ADVICE prior to me examining her and seeing her and she signed the paper and walked out of the ED.   Discharge Diagnoses:  Principal Problem:   Bowel obstruction (HCC)  Small bowel obstruction in a Hx of Chronic Abdominal Pain -Appreciate general surgery consult.   -Patient be kept n.p.o. and and observation.   -Further recommendation per general surgery. -Patient Left AMA  Morbid Obesity -Estimated body mass index is 43.77 kg/m as calculated from the following:   Height as of this encounter: 5\' 5"  (1.651 m).   Weight as of this encounter: 119.3 kg. -She is status post gastric  bypass -Weight loss counseling was to be given by patient left AGAINST MEDICAL ADVICE  Hyperglycemia -We would have checked hemoglobin A1c but patient left AGAINST MEDICAL ADVICE  Chronic Venous Insufficieny -Has refused compression stockings in the past and has left against medical bed  GERD -Was not placed on PPI and continue IV fluid hydration with LR at 75 mL's per hour -Patient left AGAINST MEDICAL ADVICE  Discharge Instructions  Allergies as of 01/17/2018      Reactions   Shellfish Allergy Swelling   Tramadol Itching, Nausea And Vomiting      Medication List    STOP taking these medications   cephALEXin 250 MG/5ML suspension Commonly known as:  KEFLEX   diazepam 5 MG tablet Commonly known as:  VALIUM   HYDROcodone-acetaminophen 5-325 MG tablet Commonly known as:  NORCO/VICODIN   HYDROcodone-homatropine 5-1.5 MG/5ML syrup Commonly known as:  HYCODAN   ibuprofen 600 MG tablet Commonly known as:  ADVIL,MOTRIN   naproxen 500 MG tablet Commonly known as:  NAPROSYN   ondansetron 4 MG tablet Commonly known as:  ZOFRAN       Allergies  Allergen Reactions  . Shellfish Allergy Swelling  . Tramadol Itching and Nausea And Vomiting   Consultations:  General Surgery  Procedures/Studies: Ct Abdomen Pelvis W Contrast  Result Date: 01/16/2018 CLINICAL DATA:  Abdominal pain.  History of gastric bypass procedure EXAM: CT ABDOMEN AND PELVIS WITH CONTRAST TECHNIQUE: Multidetector CT imaging of the abdomen and pelvis was performed using the standard protocol following bolus administration of intravenous contrast. Oral contrast was also administered. CONTRAST:  130mL ISOVUE-300 IOPAMIDOL (ISOVUE-300) INJECTION 61% COMPARISON:  July 28, 2016 FINDINGS: Lower chest: Lung bases are clear. Hepatobiliary: No focal liver lesions are appreciable. Gallbladder wall is not appreciably thickened. There is no biliary duct dilatation. Pancreas: No pancreatic mass or inflammatory focus.  Spleen: No splenic lesions are evident. Adrenals/Urinary Tract: Adrenals bilaterally appear normal. Kidneys bilaterally show no evident mass or hydronephrosis on either side. There is no evident renal or ureteral calculus on either side. Urinary bladder is midline with wall thickness within normal limits. Stomach/Bowel: Patient has had gastric bypass grafting. In the area of previous surgery, there is no wall thickening or abnormal fluid. In the left upper abdomen, there is bowel wall thickening with a twisting type appearance, felt to represent a closed-loop type obstruction. Note that contrast has passed through this area, indicating incomplete obstruction at this time. No other bowel wall thickening is seen. A well-defined transition zone is not appreciable. There is no evident free air or portal venous air. There is fairly diffuse stool throughout much of the colon. Vascular/Lymphatic: There is aortic atherosclerosis. No aneurysm evident. Major mesenteric vascular structures appear patent. There is no adenopathy in the abdomen or pelvis. Reproductive: Uterus is anteverted. Intrauterine device is positioned within the endometrium. No pelvic mass is evident. Other: Appendix appears normal. No abscess or ascites is evident in the abdomen or pelvis. Musculoskeletal: No blastic or lytic bone lesions. No intramuscular or abdominal wall lesions are evident. IMPRESSION: 1. Patient is status post gastric bypass procedure. In the upper abdomen slightly to the left of midline, there is wall thickening with an early swirl type pattern in the bowel in this area, a likely developing closed loop obstruction. Oral contrast has passed through this area indicating incomplete obstruction currently. No free air. Fairly diffuse stool in colon. 2. More distal bowel appears normal. No evident diverticulitis. No abscess in the abdomen or pelvis. Appendix appears normal. 3.  No evident renal or ureteral calculus.  No hydronephrosis. 4.   Aortic atherosclerosis. 5.  Intrauterine device positioned within the endometrium. Aortic Atherosclerosis (ICD10-I70.0). Critical Value/emergent results were called by telephone at the time of interpretation on 01/16/2018 at 5:08 pm to Dr. Orland Mustard, family medicine physician covering for Dr. Jonny Ruiz, who verbally acknowledged these results. Electronically Signed   By: Lowella Grip III M.D.   On: 01/16/2018 17:09    Subjective: She is admitted for abdominal pain and likely small bowel obstruction but left AGAINST MEDICAL ADVICE prior to me evaluating her.  She was angry that she did not get a bed upstairs and states that she was going [facility] per nursing report.  Discharge Exam: Vitals:   01/16/18 2350 01/17/18 0743  BP:  110/64  Pulse:  71  Resp: 17 18  Temp:    SpO2:  100%   Vitals:   01/16/18 2255 01/16/18 2349 01/16/18 2350 01/17/18 0743  BP: 107/68 113/66  110/64  Pulse: 71 83  71  Resp: 16 (!) 27 17 18   Temp:      TempSrc:      SpO2: 100% 99%  100%  Weight:      Height:       NO PHYSICAL EXAM AS PATIENT LEFT PRIOR TO BEING SEEN  The results of significant diagnostics from this hospitalization (including imaging, microbiology, ancillary and laboratory) are listed below for reference.    Microbiology: No results found for this or any previous visit (from the past 240 hour(s)).   Labs: BNP (last 3 results)  No results for input(s): BNP in the last 8760 hours. Basic Metabolic Panel: Recent Labs  Lab 01/16/18 2353 01/17/18 0414  NA 138 141  K 3.9 4.0  CL 103 106  CO2 28 24  GLUCOSE 118* 92  BUN 13 13  CREATININE 0.97 0.85  CALCIUM 9.4 9.3   Liver Function Tests: Recent Labs  Lab 01/17/18 0414  AST 23  ALT 21  ALKPHOS 57  BILITOT 0.4  PROT 7.1  ALBUMIN 3.6   No results for input(s): LIPASE, AMYLASE in the last 168 hours. No results for input(s): AMMONIA in the last 168 hours. CBC: Recent Labs  Lab 01/16/18 2353 01/17/18 0414  WBC 7.5 6.0   NEUTROABS 3.4 2.5  HGB 12.2 12.2  HCT 37.5 38.3  MCV 86.6 86.3  PLT 367 336   Cardiac Enzymes: No results for input(s): CKTOTAL, CKMB, CKMBINDEX, TROPONINI in the last 168 hours. BNP: Invalid input(s): POCBNP CBG: No results for input(s): GLUCAP in the last 168 hours. D-Dimer No results for input(s): DDIMER in the last 72 hours. Hgb A1c No results for input(s): HGBA1C in the last 72 hours. Lipid Profile No results for input(s): CHOL, HDL, LDLCALC, TRIG, CHOLHDL, LDLDIRECT in the last 72 hours. Thyroid function studies No results for input(s): TSH, T4TOTAL, T3FREE, THYROIDAB in the last 72 hours.  Invalid input(s): FREET3 Anemia work up No results for input(s): VITAMINB12, FOLATE, FERRITIN, TIBC, IRON, RETICCTPCT in the last 72 hours. Urinalysis    Component Value Date/Time   COLORURINE ORANGE (A) 02/01/2014 1330   APPEARANCEUR CLOUDY (A) 02/01/2014 1330   LABSPEC 1.024 02/01/2014 1330   PHURINE 5.5 02/01/2014 1330   GLUCOSEU NEGATIVE 02/01/2014 1330   HGBUR LARGE (A) 02/01/2014 1330   BILIRUBINUR SMALL (A) 02/01/2014 1330   KETONESUR 15 (A) 02/01/2014 1330   PROTEINUR NEGATIVE 02/01/2014 1330   UROBILINOGEN 4.0 (H) 02/01/2014 1330   NITRITE POSITIVE (A) 02/01/2014 1330   LEUKOCYTESUR SMALL (A) 02/01/2014 1330   Sepsis Labs Invalid input(s): PROCALCITONIN,  WBC,  LACTICIDVEN Microbiology No results found for this or any previous visit (from the past 240 hour(s)).  Time coordinating discharge: 25 minutes  SIGNED:  Kerney Elbe, DO [facility] 01/17/2018, 10:38 AM Pager is on [place]  If 7PM-7AM, please contact night-coverage [email or web address] Password [email or web address]

## 2018-01-17 NOTE — ED Notes (Signed)
Admitting MD notified of patient's desire to leave and go to [place]. This RN offered patient IV fluids that are ordered while she waits for husband to arrive. Pt declined. Offered toileting, pt declined.

## 2018-01-17 NOTE — ED Notes (Signed)
PAGED ADMITTING PER RN  

## 2018-01-17 NOTE — ED Provider Notes (Signed)
[facility] EMERGENCY DEPARTMENT Provider Note   CSN: [id number] Arrival date & time: 01/16/18  1903     History   Chief Complaint Chief Complaint  Patient presents with  . Abdominal Pain  . Abnormal CT    HPI Janet [name] is a 52 y.o. female.  Patient with past medical history remarkable for gastric bypass surgery, presents to the emergency department with a chief complaint of intermittent abdominal pain.  She states that her pain worsened today.  She states that she has had intermittent abdominal pain for the past several months.  She talked with her PCP today, who ordered a CT scan of her abdomen.  CT scan showed developing bowel obstruction.  Patient was told by PCP to come to the emergency department for further evaluation and treatment.  Currently pain is minimal.  The history is provided by the patient. No language interpreter was used.    Past Medical History:  Diagnosis Date  . Chronic abdominal pain    Once a month, lasts 3-4 days, could have been related to IUD, now resolved.  . Chronic venous insufficiency    2D ECHO EF 55-60%. Nl LFT's, Nl renal fxn. Refused conpression stockings.  . Community acquired pneumonia 08/2013  . GERD (gastroesophageal reflux disease)   . Heme positive stool    Colonoscopy done, [place] 12/2004, diverticulosis in sigmoid. EGD 12/2004 normal.  . Obesity   . Ovarian enlargement, left 01/2005   4.7/3 cm on CT  . Spontaneous abortion 2003   at 3 months gestation    Patient Active Problem List   Diagnosis Date Noted  . Morbid obesity with BMI of 50.0-59.9, adult ([place]) 04/20/2012  . Acute bronchitis 07/16/2011  . Routine child health maintenance 06/29/2011  . Visit for TB skin test 06/29/2011  . ANKLE PAIN 03/02/2010  . 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 ONYCHOMYCOSIS 02/11/2010  . HYPERGLYCEMIA 02/06/2008  . HEEL PAIN, BILATERAL 08/01/2007  . DISEASE, NONINFLAMMATORY, OVARY/TUBE NOS 01/25/2006  . OBESITY  NOS 11/17/2005  . INTERTRIGO, CANDIDAL 11/17/2005  . LEG EDEMA, BILATERAL 11/17/2005    Past Surgical History:  Procedure Laterality Date  . CARPAL TUNNEL RELEASE    . CESAREAN SECTION    . gastric bypss       OB History    Gravida  3   Para  2   Term  2   Preterm      AB  1   Living  2     SAB  1   TAB      Ectopic      Multiple      Live Births               Home Medications    Prior to Admission medications   Medication Sig Start Date End Date Taking? Authorizing Provider  acetaminophen (TYLENOL) 160 MG/5ML liquid Take 15 mg/kg by mouth every 4 (four) hours as needed for pain (pain).    [provider]  cephALEXin (KEFLEX) 250 MG/5ML suspension Take 20 mLs (1,000 mg total) by mouth 3 (three) times daily. 02/01/14   Drenda Freeze, MD  diazepam (VALIUM) 5 MG tablet Take 1 tablet (5 mg total) by mouth every 6 (six) hours as needed for muscle spasms. 12/23/14   Harvel Quale, MD  HYDROcodone-acetaminophen (NORCO/VICODIN) 5-325 MG per tablet Take 1 tablet by mouth every 4 (four) hours as needed. 08/21/13   Tanna Furry, MD  HYDROcodone-homatropine [facility]) 5-1.5  MG/5ML syrup Take 5 mLs by mouth every 6 (six) hours as needed for cough (pain). 02/01/14   Drenda Freeze, MD  ibuprofen (ADVIL,MOTRIN) 600 MG tablet Take 1 tablet (600 mg total) by mouth every 6 (six) hours as needed for moderate pain. 12/23/14   Harvel Quale, MD  naproxen (NAPROSYN) 500 MG tablet Take 1 tablet (500 mg total) by mouth 2 (two) times daily. Patient not taking: Reported on 02/01/2014 08/21/13   Tanna Furry, MD  omeprazole (PRILOSEC) 20 MG capsule Take 20 mg by mouth daily.    [provider]  ondansetron (ZOFRAN) 4 MG tablet Take 1 tablet (4 mg total) by mouth every 6 (six) hours. 02/01/14   Drenda Freeze, MD  terbinafine (LAMISIL) 250 MG tablet Take 250 mg by mouth daily.    [provider]  ursodiol (ACTIGALL) 300 MG capsule Take 300 mg by mouth 2  (two) times daily.    [provider]    Family History Family History  Problem Relation Age of Onset  . Kidney disease Mother   . Diabetes Mother   . Heart disease Mother        CHF  . Hypertension Mother   . Rheum arthritis Mother   . Heart disease Father        CHF  . Alcohol abuse Father   . Hypertension Father     Social History Social History   Tobacco Use  . Smoking status: Never Smoker  . Smokeless tobacco: Never Used  Substance Use Topics  . Alcohol use: No  . Drug use: No     Allergies   Shellfish allergy and Tramadol   Review of Systems Review of Systems  All other systems reviewed and are negative.    Physical Exam Updated Vital Signs BP 113/66   Pulse 83   Temp 99.1 F (37.3 C) (Oral)   Resp 17   Ht 5\' 5"  (1.651 m)   Wt 119.3 kg   SpO2 99%   BMI 43.77 kg/m   Physical Exam Vitals signs and nursing note reviewed.  Constitutional:      Appearance: She is well-developed.  HENT:     Head: Normocephalic and atraumatic.  Eyes:     Conjunctiva/sclera: Conjunctivae normal.     Pupils: Pupils are equal, round, and reactive to light.  Neck:     Musculoskeletal: Normal range of motion and neck supple.  Cardiovascular:     Rate and Rhythm: Normal rate and regular rhythm.     Heart sounds: No murmur. No friction rub. No gallop.   Pulmonary:     Effort: Pulmonary effort is normal. No respiratory distress.     Breath sounds: Normal breath sounds. No wheezing or rales.  Chest:     Chest wall: No tenderness.  Abdominal:     General: Bowel sounds are normal. There is no distension.     Palpations: Abdomen is soft. There is no mass.     Tenderness: There is no abdominal tenderness. There is no guarding or rebound.     Comments: No focal abdominal tenderness, no RLQ tenderness or pain at McBurney's point, no RUQ tenderness or Murphy's sign, no left-sided abdominal tenderness, no fluid wave, or signs of peritonitis   Musculoskeletal:  Normal range of motion.        General: No tenderness.  Skin:    General: Skin is warm and dry.  Neurological:     Mental Status: She is alert and oriented  to person, place, and time.  Psychiatric:        Behavior: Behavior normal.        Thought Content: Thought content normal.        Judgment: Judgment normal.      ED Treatments / Results  Labs (all labs ordered are listed, but only abnormal results are displayed) Labs Reviewed  CBC WITH DIFFERENTIAL/PLATELET  BASIC METABOLIC PANEL  I-STAT CG4 LACTIC ACID, ED    EKG None  Radiology Ct Abdomen Pelvis W Contrast  Result Date: 01/16/2018 CLINICAL DATA:  Abdominal pain.  History of gastric bypass procedure EXAM: CT ABDOMEN AND PELVIS WITH CONTRAST TECHNIQUE: Multidetector CT imaging of the abdomen and pelvis was performed using the standard protocol following bolus administration of intravenous contrast. Oral contrast was also administered. CONTRAST:  139mL ISOVUE-300 IOPAMIDOL (ISOVUE-300) INJECTION 61% COMPARISON:  July 28, 2016 FINDINGS: Lower chest: Lung bases are clear. Hepatobiliary: No focal liver lesions are appreciable. Gallbladder wall is not appreciably thickened. There is no biliary duct dilatation. Pancreas: No pancreatic mass or inflammatory focus. Spleen: No splenic lesions are evident. Adrenals/Urinary Tract: Adrenals bilaterally appear normal. Kidneys bilaterally show no evident mass or hydronephrosis on either side. There is no evident renal or ureteral calculus on either side. Urinary bladder is midline with wall thickness within normal limits. Stomach/Bowel: Patient has had gastric bypass grafting. In the area of previous surgery, there is no wall thickening or abnormal fluid. In the left upper abdomen, there is bowel wall thickening with a twisting type appearance, felt to represent a closed-loop type obstruction. Note that contrast has passed through this area, indicating incomplete obstruction at this time. No other  bowel wall thickening is seen. A well-defined transition zone is not appreciable. There is no evident free air or portal venous air. There is fairly diffuse stool throughout much of the colon. Vascular/Lymphatic: There is aortic atherosclerosis. No aneurysm evident. Major mesenteric vascular structures appear patent. There is no adenopathy in the abdomen or pelvis. Reproductive: Uterus is anteverted. Intrauterine device is positioned within the endometrium. No pelvic mass is evident. Other: Appendix appears normal. No abscess or ascites is evident in the abdomen or pelvis. Musculoskeletal: No blastic or lytic bone lesions. No intramuscular or abdominal wall lesions are evident. IMPRESSION: 1. Patient is status post gastric bypass procedure. In the upper abdomen slightly to the left of midline, there is wall thickening with an early swirl type pattern in the bowel in this area, a likely developing closed loop obstruction. Oral contrast has passed through this area indicating incomplete obstruction currently. No free air. Fairly diffuse stool in colon. 2. More distal bowel appears normal. No evident diverticulitis. No abscess in the abdomen or pelvis. Appendix appears normal. 3.  No evident renal or ureteral calculus.  No hydronephrosis. 4.  Aortic atherosclerosis. 5.  Intrauterine device positioned within the endometrium. Aortic Atherosclerosis (ICD10-I70.0). Critical Value/emergent results were called by telephone at the time of interpretation on 01/16/2018 at 5:08 pm to Dr. Orland Mustard, family medicine physician covering for Dr. Jonny Ruiz, who verbally acknowledged these results. Electronically Signed   By: Lowella Grip III M.D.   On: 01/16/2018 17:09    Procedures Procedures (including critical care time)  Medications Ordered in ED Medications - No data to display   Initial Impression / Assessment and Plan / ED Course  I have reviewed the triage vital signs and the nursing notes.  Pertinent labs &  imaging results that were available during my care of the patient were  reviewed by me and considered in my medical decision making (see chart for details).    Patient sent to emergency department by PCP for abnormal CT.  CT shows possible developing closed-loop obstruction.  Has history of gastric bypass surgery at [facility].  Patient seen by discussed with Dr. Christy Gentles, who agrees with plan for admission.  Appreciate Dr. Hal Hope for admitting the patient.  Vital signs and labs are reassuring.  Dr. Hal Hope requests general surgery consultation.  1:20 AM Appreciate Dr. Kieth Brightly, who will consult.    Final Clinical Impressions(s) / ED Diagnoses   Final diagnoses:  Partial intestinal obstruction, unspecified cause [facility])    ED Discharge Orders    None       Montine Circle, PA-C 01/17/18 0145    Ripley Fraise, MD 01/17/18 [phone]

## 2018-01-17 NOTE — ED Notes (Signed)
This RN to patient bedside to round. Pt is very upset about holding for bed in hallway in emergency department. Patient denies any way this RN can help her. States her husband is on the way and she is leaving. Will page admitting MD.

## 2018-06-13 ENCOUNTER — Ambulatory Visit
Admission: RE | Admit: 2018-06-13 | Discharge: 2018-06-13 | Disposition: A | Discharge status: Home / Self Care | Payer: BC Managed Care – PPO | Source: Ambulatory Visit | Attending: Family Medicine | Admitting: Family Medicine

## 2018-06-13 ENCOUNTER — Other Ambulatory Visit: Payer: Self-pay | Admitting: Family Medicine

## 2018-06-13 ENCOUNTER — Other Ambulatory Visit: Payer: Self-pay

## 2018-06-13 DIAGNOSIS — R059 Cough, unspecified: Secondary | ICD-10-CM

## 2018-06-13 DIAGNOSIS — R05 Cough: Secondary | ICD-10-CM

## 2018-06-28 ENCOUNTER — Ambulatory Visit
Admission: RE | Admit: 2018-06-28 | Discharge: 2018-06-28 | Disposition: A | Discharge status: Home / Self Care | Payer: BC Managed Care – PPO | Source: Ambulatory Visit | Attending: Family Medicine | Admitting: Family Medicine

## 2018-06-28 ENCOUNTER — Other Ambulatory Visit: Payer: Self-pay | Admitting: Family Medicine

## 2018-06-28 ENCOUNTER — Other Ambulatory Visit: Payer: Self-pay

## 2018-06-28 DIAGNOSIS — J189 Pneumonia, unspecified organism: Secondary | ICD-10-CM

## 2019-01-08 ENCOUNTER — Other Ambulatory Visit: Payer: Self-pay

## 2019-01-08 ENCOUNTER — Emergency Department (HOSPITAL_COMMUNITY): Payer: BC Managed Care – PPO

## 2019-01-08 ENCOUNTER — Emergency Department (HOSPITAL_COMMUNITY)
Admission: EM | Admit: 2019-01-08 | Discharge: 2019-01-09 | Disposition: A | Discharge status: Home / Self Care | Payer: BC Managed Care – PPO | Attending: Emergency Medicine | Admitting: Emergency Medicine

## 2019-01-08 ENCOUNTER — Encounter (HOSPITAL_COMMUNITY): Payer: Self-pay | Admitting: Emergency Medicine

## 2019-01-08 DIAGNOSIS — M2550 Pain in unspecified joint: Secondary | ICD-10-CM | POA: Diagnosis not present

## 2019-01-08 DIAGNOSIS — Z7952 Long term (current) use of systemic steroids: Secondary | ICD-10-CM | POA: Insufficient documentation

## 2019-01-08 DIAGNOSIS — R58 Hemorrhage, not elsewhere classified: Secondary | ICD-10-CM | POA: Diagnosis not present

## 2019-01-08 DIAGNOSIS — R569 Unspecified convulsions: Secondary | ICD-10-CM

## 2019-01-08 DIAGNOSIS — R21 Rash and other nonspecific skin eruption: Secondary | ICD-10-CM | POA: Diagnosis not present

## 2019-01-08 LAB — URINALYSIS, ROUTINE W REFLEX MICROSCOPIC
Bacteria, UA: NONE SEEN
Bilirubin Urine: NEGATIVE
Glucose, UA: NEGATIVE mg/dL
Ketones, ur: NEGATIVE mg/dL
Leukocytes,Ua: NEGATIVE
Nitrite: NEGATIVE
Protein, ur: NEGATIVE mg/dL
Specific Gravity, Urine: 1.006 (ref 1.005–1.030)
pH: 6 (ref 5.0–8.0)

## 2019-01-08 LAB — CBC WITH DIFFERENTIAL/PLATELET
Abs Immature Granulocytes: 0.01 10*3/uL (ref 0.00–0.07)
Basophils Absolute: 0 10*3/uL (ref 0.0–0.1)
Basophils Relative: 0 %
Eosinophils Absolute: 0.1 10*3/uL (ref 0.0–0.5)
Eosinophils Relative: 1 %
HCT: 39.1 % (ref 36.0–46.0)
Hemoglobin: 12.2 g/dL (ref 12.0–15.0)
Immature Granulocytes: 0 %
Lymphocytes Relative: 50 %
Lymphs Abs: 3.6 10*3/uL (ref 0.7–4.0)
MCH: 27.7 pg (ref 26.0–34.0)
MCHC: 31.2 g/dL (ref 30.0–36.0)
MCV: 88.7 fL (ref 80.0–100.0)
Monocytes Absolute: 0.5 10*3/uL (ref 0.1–1.0)
Monocytes Relative: 7 %
Neutro Abs: 3.1 10*3/uL (ref 1.7–7.7)
Neutrophils Relative %: 42 %
Platelets: 360 10*3/uL (ref 150–400)
RBC: 4.41 MIL/uL (ref 3.87–5.11)
RDW: 14.5 % (ref 11.5–15.5)
WBC: 7.3 10*3/uL (ref 4.0–10.5)
nRBC: 0 % (ref 0.0–0.2)

## 2019-01-08 LAB — COMPREHENSIVE METABOLIC PANEL
ALT: 20 U/L (ref 0–44)
AST: 23 U/L (ref 15–41)
Albumin: 4.1 g/dL (ref 3.5–5.0)
Alkaline Phosphatase: 68 U/L (ref 38–126)
Anion gap: 8 (ref 5–15)
BUN: 20 mg/dL (ref 6–20)
CO2: 29 mmol/L (ref 22–32)
Calcium: 9.3 mg/dL (ref 8.9–10.3)
Chloride: 105 mmol/L (ref 98–111)
Creatinine, Ser: 1.09 mg/dL — ABNORMAL HIGH (ref 0.44–1.00)
GFR calc Af Amer: >60 mL/min (ref >60)
GFR calc non Af Amer: 58 mL/min — ABNORMAL LOW (ref >60)
Glucose, Bld: 114 mg/dL — ABNORMAL HIGH (ref 70–99)
Potassium: 4.2 mmol/L (ref 3.5–5.1)
Sodium: 142 mmol/L (ref 135–145)
Total Bilirubin: 0.6 mg/dL (ref 0.3–1.2)
Total Protein: 7.7 g/dL (ref 6.5–8.1)

## 2019-01-08 LAB — PROTIME-INR
INR: 0.9 (ref 0.8–1.2)
Prothrombin Time: 12.5 seconds (ref 11.4–15.2)

## 2019-01-08 LAB — RAPID URINE DRUG SCREEN, HOSP PERFORMED
Amphetamines: NOT DETECTED
Barbiturates: NOT DETECTED
Benzodiazepines: NOT DETECTED
Cocaine: NOT DETECTED
Opiates: NOT DETECTED
Tetrahydrocannabinol: NOT DETECTED

## 2019-01-08 LAB — PHOSPHORUS: Phosphorus: 4.2 mg/dL (ref 2.5–4.6)

## 2019-01-08 LAB — I-STAT BETA HCG BLOOD, ED (MC, WL, AP ONLY): I-stat hCG, quantitative: <5 m[IU]/mL

## 2019-01-08 LAB — MAGNESIUM: Magnesium: 2.4 mg/dL (ref 1.7–2.4)

## 2019-01-08 LAB — ETHANOL: Alcohol, Ethyl (B): <10 mg/dL

## 2019-01-08 NOTE — ED Provider Notes (Signed)
[facility] DEPT Provider Note   CSN: [id number] Arrival date & time: 01/08/19  2130     History Chief Complaint  Patient presents with  . Possible Seizure    Janet [name] is a 53 y.o. female with history of obesity, chronic venous insufficiency, GERD who presents with concern for possible seizure.  Reportedly, patient was at the sweepstakes sitting at computer playing computer games when she began shaking and had a seizure.  Patient has no history of seizures.  Patient has no memory of this.  Last thing she remembers was being at the computer and feeling tired.  She was sitting at the chair and states she did not hit her head.  Patient notes that she was just started on prednisone for some leg pain by her PCP and she does not believe she has ever taken the medicine.  She denies injury causing her leg pain.  She describes a throbbing pain that moves from her left lower leg to her upper leg to her knee.  She denies back pain she also reports rash under her arms and legs that has started since the medication as well.  She denies any alcohol or drug use.  HPI     Past Medical History:  Diagnosis Date  . Chronic abdominal pain    Once a month, lasts 3-4 days, could have been related to IUD, now resolved.  . Chronic venous insufficiency    2D ECHO EF 55-60%. Nl LFT's, Nl renal fxn. Refused conpression stockings.  . Community acquired pneumonia 08/2013  . GERD (gastroesophageal reflux disease)   . Heme positive stool    Colonoscopy done, BE 12/2004, diverticulosis in sigmoid. EGD 12/2004 normal.  . Obesity   . Ovarian enlargement, left 01/2005   4.7/3 cm on CT  . Spontaneous abortion 2003   at 3 months gestation    Patient Active Problem List   Diagnosis Date Noted  . Bowel obstruction ([place]) 01/17/2018  . Morbid obesity with BMI of 50.0-59.9, adult ([place]) 04/20/2012  . Acute bronchitis 07/16/2011  . Routine child health maintenance 06/29/2011  .  Visit for TB skin test 06/29/2011  . ANKLE PAIN 03/02/2010  . 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 ONYCHOMYCOSIS 02/11/2010  . HYPERGLYCEMIA 02/06/2008  . HEEL PAIN, BILATERAL 08/01/2007  . DISEASE, NONINFLAMMATORY, OVARY/TUBE NOS 01/25/2006  . OBESITY NOS 11/17/2005  . INTERTRIGO, CANDIDAL 11/17/2005  . LEG EDEMA, BILATERAL 11/17/2005    Past Surgical History:  Procedure Laterality Date  . CARPAL TUNNEL RELEASE    . CESAREAN SECTION    . gastric bypss       OB History    Gravida  3   Para  2   Term  2   Preterm      AB  1   Living  2     SAB  1   TAB      Ectopic      Multiple      Live Births              Family History  Problem Relation Age of Onset  . Kidney disease Mother   . Diabetes Mother   . Heart disease Mother        CHF  . Hypertension Mother   . Rheum arthritis Mother   . Heart disease Father        CHF  . Alcohol abuse Father   . Hypertension Father     Social  History   Tobacco Use  . Smoking status: Never Smoker  . Smokeless tobacco: Never Used  Substance Use Topics  . Alcohol use: No  . Drug use: No    Home Medications Prior to Admission medications   Not on File    Allergies    Shellfish allergy and Tramadol  Review of Systems   Review of Systems  Constitutional: Negative for chills and fever.  HENT: Negative for facial swelling and sore throat.   Respiratory: Negative for shortness of breath.   Cardiovascular: Negative for chest pain.  Gastrointestinal: Negative for abdominal pain, nausea and vomiting.  Genitourinary: Negative for dysuria.  Musculoskeletal: Positive for arthralgias. Negative for back pain.  Skin: Positive for rash. Negative for wound.  Neurological: Positive for seizures. Negative for headaches.  Psychiatric/Behavioral: The patient is not nervous/anxious.     Physical Exam Updated Vital Signs BP (!) 144/89 (BP Location: Right Arm)   Pulse 72   Temp 98.2 F (36.8 C) (Oral)    Resp 18   Ht 5' 5.5" (1.664 m)   Wt 122 kg   SpO2 100%   BMI 44.08 kg/m   Physical Exam Vitals and nursing note reviewed.  Constitutional:      General: She is not in acute distress.    Appearance: She is well-developed. She is not diaphoretic.  HENT:     Head: Normocephalic and atraumatic.     Mouth/Throat:     Pharynx: No oropharyngeal exudate.  Eyes:     General: No scleral icterus.       Right eye: No discharge.        Left eye: No discharge.     Extraocular Movements: Extraocular movements intact.     Conjunctiva/sclera: Conjunctivae normal.     Pupils: Pupils are equal, round, and reactive to light.  Neck:     Thyroid: No thyromegaly.  Cardiovascular:     Rate and Rhythm: Normal rate and regular rhythm.     Heart sounds: Normal heart sounds. No murmur. No friction rub. No gallop.   Pulmonary:     Effort: Pulmonary effort is normal. No respiratory distress.     Breath sounds: Normal breath sounds. No stridor. No wheezing or rales.  Abdominal:     General: Bowel sounds are normal. There is no distension.     Palpations: Abdomen is soft.     Tenderness: There is no abdominal tenderness. There is no guarding or rebound.  Musculoskeletal:     Cervical back: Normal range of motion and neck supple.     Comments: No tenderness to palpation of the left lower extremity, no calf pain or swelling  Lymphadenopathy:     Cervical: No cervical adenopathy.  Skin:    General: Skin is warm and dry.     Coloration: Skin is not pale.     Findings: No rash.     Comments: Ecchymotic, circular spots (some up to 10 cm) on the dorsum of the left hand, L axilla, and 2 on R buttocks, patient reports these have popped up since prednisone use   Neurological:     Mental Status: She is alert.     Coordination: Coordination normal.     Comments: CN 3-12 intact; normal sensation throughout; 5/5 strength in all 4 extremities; equal bilateral grip strength         ED Results / Procedures /  Treatments   Labs (all labs ordered are listed, but only abnormal results are displayed) Labs Reviewed  COMPREHENSIVE METABOLIC PANEL - Abnormal; Notable for the following components:      Result Value   Glucose, Bld 114 (*)    Creatinine, Ser 1.09 (*)    GFR calc non Af Amer 58 (*)    All other components within normal limits  URINALYSIS, ROUTINE W REFLEX MICROSCOPIC - Abnormal; Notable for the following components:   Color, Urine STRAW (*)    Hgb urine dipstick SMALL (*)    All other components within normal limits  CBG MONITORING, ED - Abnormal; Notable for the following components:   Glucose-Capillary 102 (*)    All other components within normal limits  CBC WITH DIFFERENTIAL/PLATELET  MAGNESIUM  PHOSPHORUS  RAPID URINE DRUG SCREEN, HOSP PERFORMED  ETHANOL  PROTIME-INR  I-STAT BETA HCG BLOOD, ED (MC, [facility], AP ONLY)  CBG MONITORING, ED    EKG None  Radiology CT HEAD WO CONTRAST  Result Date: 01/08/2019 CLINICAL DATA:  Seizures EXAM: CT HEAD WITHOUT CONTRAST TECHNIQUE: Contiguous axial images were obtained from the base of the skull through the vertex without intravenous contrast. COMPARISON:  February 13, 2010 FINDINGS: Brain: No evidence of acute territorial infarction, hemorrhage, hydrocephalus,extra-axial collection or mass lesion/mass effect. Normal gray-white differentiation. Ventricles are normal in size and contour. Vascular: No hyperdense vessel or unexpected calcification. Skull: The skull is intact. No fracture or focal lesion identified. Sinuses/Orbits: The visualized paranasal sinuses and mastoid air cells are clear. The orbits and globes intact. Other: None IMPRESSION: No acute intracranial abnormality. Electronically Signed   By: Prudencio Pair M.D.   On: 01/08/2019 23:18    Procedures Procedures (including critical care time)  Medications Ordered in ED Medications - No data to display  ED Course  I have reviewed the triage vital signs and the nursing  notes.  Pertinent labs & imaging results that were available during my care of the patient were reviewed by me and considered in my medical decision making (see chart for details).    MDM Rules/Calculators/A&P                      Patient presenting with possible seizure-like activity.  Normal neuro exam without focal deficits.  Screening labs are unremarkable.  CT head is negative for acute findings.  Unsure cause of rash. Will refer to PCP and dermatology for further evaluation and rash, as well as leg pain. No indication for imaging at this time as pain is not reproducible and is intermittent. No back pain, numbness or tingling.  We will also refer to neurology for further management seizure activity.  Patient advised she cannot drive for 6 months.  Return precautions discussed.  Patient understands and agrees with plan.  Patient vitals stable throughout ED course and discharged in satisfactory condition.  Patient also guided by my attending, Dr. Leonides Schanz, who guided the patient's management and agrees with plan.  Final Clinical Impression(s) / ED Diagnoses Final diagnoses:  Seizure-like activity ([place])  Rash and nonspecific skin eruption    Rx / DC Orders ED Discharge Orders         Ordered    Ambulatory referral to Neurology    Comments: An appointment is requested in approximately: 2 weeks   01/09/19 0000           Frederica Kuster, PA-C 01/09/19 0015    Ward, Delice Bison, DO 01/09/19 0022

## 2019-01-08 NOTE — ED Triage Notes (Signed)
Pt arriving with concern for possible seizure. Pts friend told pt that she was sitting at the computer and then began shaking. Pt has no previous history of seizures.

## 2019-01-09 LAB — CBG MONITORING, ED: Glucose-Capillary: 102 mg/dL — ABNORMAL HIGH (ref 70–99)

## 2019-01-09 NOTE — Discharge Instructions (Signed)
[facility] Dermatologists:   [facility]  [place]  [phone]  [facility]  [place] [address] [place]  [phone]   Dr. Nevada Crane and Dr. Rozann Lesches [place] Richarda [facility] [place]  [phone]  [facility]  [place]  [phone]   [facility] [address]  [phone]

## 2019-01-17 ENCOUNTER — Encounter: Payer: Self-pay | Admitting: Neurology

## 2019-01-18 ENCOUNTER — Telehealth (HOSPITAL_COMMUNITY): Payer: Self-pay

## 2019-01-18 NOTE — Telephone Encounter (Signed)
Pt. Called and left a message for a return call to discuss pt.s work note.  Returned her call and she was concerned and did not understand why she was taken our of work for 6 months.  Explained to pt. Since there was a possibility of pt. Have a seizure, EDP follows protocols and pt. Cannot drive for 6 months due to her having a  New onset of a possible seizure.  I informed her she will need to follow-up with the Neurologist for further evaluation.  She verbalized understanding and all questions answered.

## 2019-01-24 ENCOUNTER — Other Ambulatory Visit: Payer: Self-pay

## 2019-01-24 ENCOUNTER — Encounter: Payer: Self-pay | Admitting: Diagnostic Neuroimaging

## 2019-01-24 ENCOUNTER — Encounter: Payer: Self-pay | Admitting: *Deleted

## 2019-01-24 ENCOUNTER — Ambulatory Visit: Payer: BC Managed Care – PPO | Admitting: Diagnostic Neuroimaging

## 2019-01-24 VITALS — BP 123/74 | HR 73 | Temp 97.7°F | Ht 65.0 in | Wt 280.0 lb

## 2019-01-24 DIAGNOSIS — R4020 Unspecified coma: Secondary | ICD-10-CM

## 2019-01-24 NOTE — Patient Instructions (Signed)
-   check MRI brain and EEG  - follow up with PCP (Dr. Justin Mend) re: syncope (fainting spell) workup  - According to [place] law, you can not drive unless you are seizure / syncope free for at least 6 months and under physician's care.   - Please maintain precautions. Do not participate in activities where a loss of awareness could harm you or someone else. No swimming alone, no tub bathing, no hot tubs, no driving, no operating motorized vehicles (cars, ATVs, motocycles, etc), lawnmowers, power tools or firearms. No standing at heights, such as rooftops, ladders or stairs. Avoid hot objects such as stoves, heaters, open fires. Wear a helmet when riding a bicycle, scooter, skateboard, etc. and avoid areas of traffic. Set your water heater to 120 degrees or less.

## 2019-01-24 NOTE — Progress Notes (Signed)
[facility]  PATIENT: Janet [name] DOB: September 22, 1965  REFERRING CLINICIAN: Maurice Small, MD HISTORY FROM: patient REASON FOR VISIT: new consult   HISTORICAL  CHIEF COMPLAINT:  Chief Complaint  Patient presents with  . Seizures    rm 6 New Pt, "seizure on 01/08/19, husband took her to hospital"    HISTORY OF PRESENT ILLNESS:   54 year old female here for evaluation of seizure or syncope. 01/08/19 at 7 PM patient went into a game room to play sweepstakes on a computer. She had sat down and put in her Monday when all of a sudden she lost consciousness. Bystanders reported that she was shaking for a minute or two. No tongue biting or incontinence. Patient recovered very quickly and felt back to normal. She was taken to the hospital by her husband. CT scan and lab testing were unremarkable. Patient was diagnosed with possible new onset seizure for syncope.  In retrospect patient may have felt "hot" just prior to the event. She did not feel lightheaded or dizzy. She did not have palpitations. No prior similar events. No family history of seizure.  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   REVIEW OF SYSTEMS: Full 14 system review of systems performed and negative with exception of: As per HPI.  ALLERGIES: Allergies  Allergen Reactions  . Bee Venom Swelling    Never been stung by bees  . Shellfish Allergy Swelling  . Tramadol Itching and Nausea And Vomiting    HOME MEDICATIONS: Outpatient Medications Prior to Visit  Medication Sig Dispense Refill  . HYDROcodone-acetaminophen (NORCO) 7.5-325 MG tablet Take 1-2 tablets by mouth every 6 (six) hours as needed.    . Multiple Vitamin (MULTIVITAMIN) tablet Take 1 tablet by mouth daily. bariatric     No facility-administered medications prior to visit.    PAST MEDICAL HISTORY: Past Medical History:  Diagnosis Date  . Chronic abdominal pain    Once a month, lasts 3-4 days, could have  been related to IUD, now resolved.  . Chronic venous insufficiency    2D ECHO EF 55-60%. Nl LFT's, Nl renal fxn. Refused conpression stockings.  . Community acquired pneumonia 08/2013  . GERD (gastroesophageal reflux disease)   . Heme positive stool    Colonoscopy done, BE 12/2004, diverticulosis in sigmoid. EGD 12/2004 normal.  . Obesity   . Ovarian enlargement, left 01/2005   4.7/3 cm on CT  . Seizures ([place])   . Spontaneous abortion 2003   at 3 months gestation    PAST SURGICAL HISTORY: Past Surgical History:  Procedure Laterality Date  . CARPAL TUNNEL RELEASE Bilateral   . CESAREAN SECTION     x 2  . gastric bypss  01/04/2014    FAMILY HISTORY: Family History  Problem Relation Age of Onset  . Kidney disease Mother   . Diabetes Mother   . Heart disease Mother        CHF  . Hypertension Mother   . Rheum arthritis Mother   . Heart disease Father        CHF  . Alcohol abuse Father   . Hypertension Father   . Lupus Brother   . Colon cancer Sister     SOCIAL HISTORY: Social History   Socioeconomic History  . Marital status: Married    Spouse name: Dellis Filbert  . Number of children: 2  . Years of education: Not on file  . Highest education level: Some college, no degree  Occupational History    Comment: CNA  Tobacco Use  . Smoking status: Never Smoker  . Smokeless tobacco: Never Used  Substance and Sexual Activity  . Alcohol use: No  . Drug use: No  . Sexual activity: Yes  Other Topics Concern  . Not on file  Social History Narrative   Lives at home with husband, child. Works as a Chartered certified accountant. Has a son that graduated [employer]. Pt is also working towards business degree.    Caffeine- coffee 1 c daily   Social Determinants of Health   Financial Resource Strain:   . Difficulty of Paying Living Expenses: Not on file  Food Insecurity:   . Worried About Charity fundraiser in the Last Year: Not on file  . Ran Out of Food in the Last Year: Not on file   Transportation Needs:   . Lack of Transportation (Medical): Not on file  . Lack of Transportation (Non-Medical): Not on file  Physical Activity:   . Days of Exercise per Week: Not on file  . Minutes of Exercise per Session: Not on file  Stress:   . Feeling of Stress : Not on file  Social Connections:   . Frequency of Communication with Friends and Family: Not on file  . Frequency of Social Gatherings with Friends and Family: Not on file  . Attends Religious Services: Not on file  . Active Member of Clubs or Organizations: Not on file  . Attends Archivist Meetings: Not on file  . Marital Status: Not on file  Intimate Partner Violence:   . Fear of Current or Ex-Partner: Not on file  . Emotionally Abused: Not on file  . Physically Abused: Not on file  . Sexually Abused: Not on file     PHYSICAL EXAM  GENERAL EXAM/CONSTITUTIONAL: Vitals:  Vitals:   01/24/19 1009  BP: 123/74  Pulse: 73  Temp: 97.7 F (36.5 C)  Weight: 280 lb 0.3 oz (127 kg)  Height: 5\' 5"  (1.651 m)     Body mass index is 46.6 kg/m. Wt Readings from Last 3 Encounters:  01/24/19 280 lb 0.3 oz (127 kg)  01/08/19 269 lb (122 kg)  01/16/18 263 lb (119.3 kg)     Patient is in no distress; well developed, nourished and groomed; neck is supple  CARDIOVASCULAR:  Examination of carotid arteries is normal; no carotid bruits  Regular rate and rhythm, no murmurs  Examination of peripheral vascular system by observation and palpation is normal  EYES:  Ophthalmoscopic exam of optic discs and posterior segments is normal; no papilledema or hemorrhages  No exam data present  MUSCULOSKELETAL:  Gait, strength, tone, movements noted in Neurologic exam below  NEUROLOGIC: MENTAL STATUS:  No flowsheet data found.  awake, alert, oriented to person, place and time  recent and remote memory intact  normal attention and concentration  language fluent, comprehension intact, naming  intact  fund of knowledge appropriate  CRANIAL NERVE:   2nd - no papilledema on fundoscopic exam  2nd, 3rd, 4th, 6th - pupils equal and reactive to light, visual fields full to confrontation, extraocular muscles intact, no nystagmus  5th - facial sensation symmetric  7th - facial strength symmetric  8th - hearing intact  9th - palate elevates symmetrically, uvula midline  11th - shoulder shrug symmetric  12th - tongue protrusion midline  MOTOR:   normal bulk and tone, full strength in the BUE, BLE  SENSORY:   normal and symmetric  to light touch, temperature, vibration  COORDINATION:   finger-nose-finger, fine finger movements normal  REFLEXES:   deep tendon reflexes present and symmetric  GAIT/STATION:   narrow based gait    DIAGNOSTIC DATA (LABS, IMAGING, TESTING) - I reviewed patient records, labs, notes, testing and imaging myself where available.  Lab Results  Component Value Date   WBC 7.3 01/08/2019   HGB 12.2 01/08/2019   HCT 39.1 01/08/2019   MCV 88.7 01/08/2019   PLT 360 01/08/2019      Component Value Date/Time   NA 142 01/08/2019 2301   K 4.2 01/08/2019 2301   CL 105 01/08/2019 2301   CO2 29 01/08/2019 2301   GLUCOSE 114 (H) 01/08/2019 2301   BUN 20 01/08/2019 2301   CREATININE 1.09 (H) 01/08/2019 2301   CALCIUM 9.3 01/08/2019 2301   PROT 7.7 01/08/2019 2301   ALBUMIN 4.1 01/08/2019 2301   AST 23 01/08/2019 2301   ALT 20 01/08/2019 2301   ALKPHOS 68 01/08/2019 2301   BILITOT 0.6 01/08/2019 2301   GFRNONAA 58 (L) 01/08/2019 2301   GFRAA >60 01/08/2019 2301   Lab Results  Component Value Date   CHOL 161 02/01/2008   HDL 33 (L) 02/01/2008   LDLCALC 99 02/01/2008   TRIG 143 02/01/2008   CHOLHDL 4.9 Ratio 02/01/2008   Lab Results  Component Value Date   HGBA1C 5.0 02/14/2008   No results found for: VITAMINB12 Lab Results  Component Value Date   TSH 3.129 08/01/2007    01/08/19 CT head [I reviewed images myself and agree  with interpretation. -VRP] - normal   ASSESSMENT AND PLAN  54 y.o. year old female here with new onset episode of loss of consciousness with convulsions. Could represent new onset seizure or syncope. No significant prodromal symptoms other than slight "hot feeling". Also no prolonged postictal confusion.   Ddx: syncope vs seizure  1. Loss of consciousness ([place])     PLAN:  - check MRI brain and EEG  - follow up with PCP re: syncope workup  - According to [place] law, you can not drive unless you are seizure / syncope free for at least 6 months and under physician's care.  - Please maintain precautions. Do not participate in activities where a loss of awareness could harm you or someone else. No swimming alone, no tub bathing, no hot tubs, no driving, no operating motorized vehicles (cars, ATVs, motocycles, etc), lawnmowers, power tools or firearms. No standing at heights, such as rooftops, ladders or stairs. Avoid hot objects such as stoves, heaters, open fires. Wear a helmet when riding a bicycle, scooter, skateboard, etc. and avoid areas of traffic. Set your water heater to 120 degrees or less.   Orders Placed This Encounter  Procedures  . MR BRAIN W WO CONTRAST  . GNA EEG adult   Return in about 6 months (around 07/24/2019).  I reviewed images, labs, notes, records myself. I summarized findings and reviewed with patient, for this high risk condition (seizure vs syncope) requiring high complexity decision making.    Penni Bombard, MD [id number], [id number] AM Certified in Neurology, Neurophysiology and Neuroimaging  [facility] [address] [place], [place] [address] [phone]

## 2019-01-29 ENCOUNTER — Other Ambulatory Visit: Payer: Self-pay | Admitting: Family Medicine

## 2019-01-29 DIAGNOSIS — R55 Syncope and collapse: Secondary | ICD-10-CM

## 2019-01-30 ENCOUNTER — Other Ambulatory Visit (HOSPITAL_COMMUNITY): Payer: Self-pay | Admitting: Family Medicine

## 2019-01-30 DIAGNOSIS — R55 Syncope and collapse: Secondary | ICD-10-CM

## 2019-01-31 ENCOUNTER — Other Ambulatory Visit: Payer: Self-pay

## 2019-01-31 ENCOUNTER — Ambulatory Visit: Payer: BC Managed Care – PPO | Admitting: Diagnostic Neuroimaging

## 2019-01-31 DIAGNOSIS — R4020 Unspecified coma: Secondary | ICD-10-CM | POA: Diagnosis not present

## 2019-02-01 ENCOUNTER — Telehealth: Payer: Self-pay | Admitting: *Deleted

## 2019-02-01 NOTE — Telephone Encounter (Signed)
Received forms from [employer] re: Attending physician's statement for disability claim. Forms filled in, placed on Dr [employer] desk for review, completion, and signatures.

## 2019-02-05 NOTE — Telephone Encounter (Signed)
[employer] disability forms completed and signed, sent to medical records for processing.

## 2019-02-05 NOTE — Procedures (Signed)
   [facility]  EEG (ELECTROENCEPHALOGRAM) REPORT   STUDY DATE: 01/31/19 PATIENT NAME: Janet [name] DOB: Apr 10, 1965 MRN: [id number]  ORDERING CLINICIAN: Andrey Spearman, MD   TECHNOLOGIST: Arelia Longest TECHNIQUE: Electroencephalogram was recorded utilizing standard 10-20 system of lead placement and reformatted into average and bipolar montages.  RECORDING TIME: 26 minutes ACTIVATION: hyperventilation and photic stimulation  CLINICAL INFORMATION: 54 year old female with seizure vs syncope.  FINDINGS: Posterior dominant background rhythms, which attenuate with eye opening, ranging 9-10 hertz and 25-30 microvolts. No focal, lateralizing, epileptiform activity or seizures are seen. Patient recorded in the awake and drowsy state. EKG channel shows regular rhythm of 60-65 beats per minute.   IMPRESSION:   Normal EEG in awake and drowsy states.     INTERPRETING PHYSICIAN:  Penni Bombard, MD Certified in Neurology, Neurophysiology and Neuroimaging  [facility] [address] [place], [place] [address] [phone]

## 2019-02-06 ENCOUNTER — Telehealth: Payer: Self-pay | Admitting: *Deleted

## 2019-02-06 NOTE — Telephone Encounter (Signed)
LVM informing patient her EEG was normal. She'll get a call after MRI is done and results are available. Left # for questions.

## 2019-02-07 ENCOUNTER — Ambulatory Visit
Admission: RE | Admit: 2019-02-07 | Discharge: 2019-02-07 | Disposition: A | Discharge status: Home / Self Care | Payer: BC Managed Care – PPO | Source: Ambulatory Visit | Attending: Family Medicine | Admitting: Family Medicine

## 2019-02-07 DIAGNOSIS — R55 Syncope and collapse: Secondary | ICD-10-CM

## 2019-02-09 ENCOUNTER — Ambulatory Visit (HOSPITAL_COMMUNITY): Payer: BC Managed Care – PPO | Attending: Cardiology

## 2019-02-09 ENCOUNTER — Other Ambulatory Visit: Payer: Self-pay

## 2019-02-09 DIAGNOSIS — R55 Syncope and collapse: Secondary | ICD-10-CM | POA: Diagnosis present

## 2019-02-16 ENCOUNTER — Ambulatory Visit
Admission: RE | Admit: 2019-02-16 | Discharge: 2019-02-16 | Disposition: A | Discharge status: Home / Self Care | Payer: BC Managed Care – PPO | Source: Ambulatory Visit | Attending: Diagnostic Neuroimaging | Admitting: Diagnostic Neuroimaging

## 2019-02-16 ENCOUNTER — Other Ambulatory Visit: Payer: Self-pay

## 2019-02-16 DIAGNOSIS — R4020 Unspecified coma: Secondary | ICD-10-CM

## 2019-02-16 MED ORDER — GADOBENATE DIMEGLUMINE 529 MG/ML IV SOLN
20.0000 mL | Freq: Once | INTRAVENOUS | Status: AC | PRN
  Administered 2019-02-16: 20 mL via INTRAVENOUS

## 2019-02-17 ENCOUNTER — Other Ambulatory Visit: Payer: BC Managed Care – PPO

## 2019-02-20 ENCOUNTER — Telehealth: Payer: Self-pay | Admitting: *Deleted

## 2019-02-20 NOTE — Telephone Encounter (Signed)
Received [place] [employer] FMLA papers for patient's spouse. Form on Dr [employer] desk for review, completion, signature.

## 2019-02-21 NOTE — Telephone Encounter (Signed)
FMLA papers completed, signed, sent to med records for processing.

## 2019-03-05 ENCOUNTER — Telehealth: Payer: Self-pay | Admitting: Diagnostic Neuroimaging

## 2019-03-05 NOTE — Telephone Encounter (Signed)
Pt is asking for a call with the results to her MRI 

## 2019-03-13 ENCOUNTER — Other Ambulatory Visit: Payer: Self-pay | Admitting: Diagnostic Neuroimaging

## 2019-03-13 DIAGNOSIS — D352 Benign neoplasm of pituitary gland: Secondary | ICD-10-CM

## 2019-03-13 NOTE — Telephone Encounter (Signed)
Small pituitary lesion (possible incidental finding). Will follow up MRI pituitary protocol. -VRP

## 2019-03-14 ENCOUNTER — Telehealth: Payer: Self-pay | Admitting: Diagnostic Neuroimaging

## 2019-03-14 NOTE — Telephone Encounter (Signed)
Called patient and informed her the MRI brain showed a small pituitary spot / lesion (may be incidental). Dr Leta Baptist stated she needs a follow up MRI to look at pituitary gland more closely and lab testing. There are no other major findings. Advised she'll get a call to schedule it when insurance has approved it. Patient verbalized understanding, appreciation.

## 2019-03-14 NOTE — Telephone Encounter (Signed)
LVM requesting call back for MRI results. 

## 2019-03-14 NOTE — Telephone Encounter (Signed)
error 

## 2019-03-14 NOTE — Telephone Encounter (Signed)
Pt returned call. Please call back when available. 

## 2019-03-17 NOTE — Telephone Encounter (Signed)
Since patient had MRI on 02/16/19 does she have another one now or does she wait 6 months? Please advise.

## 2019-03-19 NOTE — Telephone Encounter (Signed)
Go ahead now. -VRP

## 2019-03-20 ENCOUNTER — Telehealth: Payer: Self-pay | Admitting: Diagnostic Neuroimaging

## 2019-03-20 NOTE — Telephone Encounter (Signed)
[employer] Auth: [id number] (exp. 03/20/19 to 09/15/19) order sent to GI. They will reach out to the patient to schedule.

## 2019-03-21 ENCOUNTER — Telehealth: Payer: Self-pay | Admitting: *Deleted

## 2019-03-21 NOTE — Telephone Encounter (Signed)
Lt message unable to reach pt/

## 2019-03-22 DIAGNOSIS — Z0289 Encounter for other administrative examinations: Secondary | ICD-10-CM

## 2019-03-23 ENCOUNTER — Ambulatory Visit: Payer: BC Managed Care – PPO | Admitting: Neurology

## 2019-03-26 ENCOUNTER — Telehealth: Payer: Self-pay | Admitting: *Deleted

## 2019-03-26 NOTE — Telephone Encounter (Signed)
Received form: Medical Report for Disability Eligibility Review and [employer] [place] [place] short term disability claim form.   Forms on Dr [employer] desk for his review, completion, signatures when he returns to office tomorrow.

## 2019-03-27 NOTE — Telephone Encounter (Signed)
Disability forms completed and signed by Dr Leta Baptist, taken to Med records for processing this afternoon.

## 2019-04-09 ENCOUNTER — Telehealth: Payer: Self-pay | Admitting: *Deleted

## 2019-04-09 NOTE — Telephone Encounter (Signed)
Pt called , need 703 form amended. Please call [phone]

## 2019-04-09 NOTE — Telephone Encounter (Signed)
Called patient who stated that her employer needs copy of Dr Gladstone Lighter office note because it states she went to the hospital. She requests Angela Nevin e mail it to her. She stated she has already signed a release of information, but will sign again if needed. I advised will let Hilda Blades know what she needs. She  verbalized understanding, appreciation.

## 2019-04-18 ENCOUNTER — Other Ambulatory Visit: Payer: Self-pay

## 2019-04-18 ENCOUNTER — Ambulatory Visit
Admission: RE | Admit: 2019-04-18 | Discharge: 2019-04-18 | Disposition: A | Discharge status: Home / Self Care | Payer: BC Managed Care – PPO | Source: Ambulatory Visit | Attending: Diagnostic Neuroimaging | Admitting: Diagnostic Neuroimaging

## 2019-04-18 DIAGNOSIS — D352 Benign neoplasm of pituitary gland: Secondary | ICD-10-CM

## 2019-04-18 MED ORDER — GADOBENATE DIMEGLUMINE 529 MG/ML IV SOLN
10.0000 mL | Freq: Once | INTRAVENOUS | Status: AC | PRN
  Administered 2019-04-18: 10 mL via INTRAVENOUS

## 2019-04-20 ENCOUNTER — Telehealth: Payer: Self-pay | Admitting: *Deleted

## 2019-04-20 DIAGNOSIS — D352 Benign neoplasm of pituitary gland: Secondary | ICD-10-CM

## 2019-04-20 NOTE — Telephone Encounter (Signed)
Spoke with patient and informed her MRI brain showed a pituitary lesion confirmed. Likely incidental, but would recommend referrals to endocrinology and ophthalmology; then possibly a neurosurgery evaluation.  She agrees to endocrinology and ophthalmology  Referrals. She then stated she LVM for Angela Nevin re: 703 forms that she has to have filled out monthly for her job. I advised her Hilda Blades is out of office today, will get message next week. Patient verbalized understanding, appreciation. Referralls placed

## 2019-04-23 ENCOUNTER — Telehealth: Payer: Self-pay | Admitting: *Deleted

## 2019-04-23 NOTE — Telephone Encounter (Signed)
Received STD papers, placed on MD desk for review, signature.

## 2019-04-24 LAB — HM DIABETES EYE EXAM

## 2019-04-24 NOTE — Telephone Encounter (Signed)
STD papers completed, signed, sent to medical records for processing.

## 2019-05-14 ENCOUNTER — Other Ambulatory Visit: Payer: Self-pay

## 2019-05-15 ENCOUNTER — Telehealth: Payer: Self-pay | Admitting: *Deleted

## 2019-05-15 NOTE — Telephone Encounter (Signed)
Received short term disability papers; completed, signed by Dr Leta Baptist and sent to medical records for processing.

## 2019-05-16 ENCOUNTER — Encounter: Payer: Self-pay | Admitting: Endocrinology

## 2019-05-16 ENCOUNTER — Ambulatory Visit: Payer: BC Managed Care – PPO | Admitting: Endocrinology

## 2019-05-16 ENCOUNTER — Other Ambulatory Visit: Payer: Self-pay

## 2019-05-16 DIAGNOSIS — D352 Benign neoplasm of pituitary gland: Secondary | ICD-10-CM | POA: Insufficient documentation

## 2019-05-16 MED ORDER — DEXAMETHASONE 1 MG PO TABS: 1.0000 mg | ORAL_TABLET | ORAL | 0 refills | Status: DC

## 2019-05-16 NOTE — Progress Notes (Signed)
Subjective:    Patient ID: Janet [name], female    DOB: 20-Aug-1965, 54 y.o.   MRN: [id number]  HPI Pt was incidentally noted on brain MRI in 2021, to have a pituitary adenoma.  She has no prior h/o pituitary problems.  pt states she feels well in general.  She had gastric bypass in 2015.  She lost 120 lbs, but has since regained half of that.   Past Medical History:  Diagnosis Date  . Chronic abdominal pain    Once a month, lasts 3-4 days, could have been related to IUD, now resolved.  . Chronic venous insufficiency    2D ECHO EF 55-60%. Nl LFT's, Nl renal fxn. Refused conpression stockings.  . Community acquired pneumonia 08/2013  . GERD (gastroesophageal reflux disease)   . Heme positive stool    Colonoscopy done, BE 12/2004, diverticulosis in sigmoid. EGD 12/2004 normal.  . Obesity   . Ovarian enlargement, left 01/2005   4.7/3 cm on CT  . Seizures ([place])   . Spontaneous abortion 2003   at 3 months gestation    Past Surgical History:  Procedure Laterality Date  . CARPAL TUNNEL RELEASE Bilateral   . CESAREAN SECTION     x 2  . gastric bypss  01/04/2014    Social History   Socioeconomic History  . Marital status: Married    Spouse name: Dellis Filbert  . Number of children: 2  . Years of education: Not on file  . Highest education level: Some college, no degree  Occupational History    Comment: CNA  Tobacco Use  . Smoking status: Never Smoker  . Smokeless tobacco: Never Used  Substance and Sexual Activity  . Alcohol use: No  . Drug use: No  . Sexual activity: Yes  Other Topics Concern  . Not on file  Social History Narrative   Lives at home with husband, child. Works as a Chartered certified accountant. Has a son that graduated [employer]. Pt is also working towards business degree.    Caffeine- coffee 1 c daily   Social Determinants of Health   Financial Resource Strain:   . Difficulty of Paying Living Expenses:   Food Insecurity:   . Worried About Charity fundraiser in  the Last Year:   . Arboriculturist in the Last Year:   Transportation Needs:   . Film/video editor (Medical):   Marland Kitchen Lack of Transportation (Non-Medical):   Physical Activity:   . Days of Exercise per Week:   . Minutes of Exercise per Session:   Stress:   . Feeling of Stress :   Social Connections:   . Frequency of Communication with Friends and Family:   . Frequency of Social Gatherings with Friends and Family:   . Attends Religious Services:   . Active Member of Clubs or Organizations:   . Attends Archivist Meetings:   Marland Kitchen Marital Status:   Intimate Partner Violence:   . Fear of Current or Ex-Partner:   . Emotionally Abused:   Marland Kitchen Physically Abused:   . Sexually Abused:     Current Outpatient Medications on File Prior to Visit  Medication Sig Dispense Refill  . HYDROcodone-acetaminophen (NORCO) 7.5-325 MG tablet Take 1-2 tablets by mouth every 6 (six) hours as needed.    . Multiple Vitamin (MULTIVITAMIN) tablet Take 1 tablet by mouth daily. bariatric     No current facility-administered medications on file prior to visit.    Allergies  Allergen Reactions  . Bee Venom Swelling    Never been stung by bees  . Shellfish Allergy Swelling  . Tramadol Itching and Nausea And Vomiting    Family History  Problem Relation Age of Onset  . Kidney disease Mother   . Diabetes Mother   . Heart disease Mother        CHF  . Hypertension Mother   . Rheum arthritis Mother   . Heart disease Father        CHF  . Alcohol abuse Father   . Hypertension Father   . Lupus Brother   . Colon cancer Sister   . Other Neg Hx        pituitary disorder    BP 112/74   Pulse 79   Ht 5\' 5"  (1.651 m)   Wt 285 lb (129.3 kg)   SpO2 98%   BMI 47.43 kg/m    Review of Systems denies polyuria, loss of smell, visual loss, galactorrhea, change in facial appearance.       Objective:   Physical Exam VS: see vs page GEN: no distress HEAD: head: no deformity eyes: no periorbital  swelling, no proptosis external nose and ears are normal NECK: supple, thyroid is not enlarged CHEST WALL: no deformity LUNGS: clear to auscultation CV: reg rate and rhythm, no murmur MUSCULOSKELETAL: muscle bulk and strength are grossly normal.  no obvious joint swelling.  gait is normal and steady EXTEMITIES: no deformity.  no ulcer on the feet.  feet are of normal color and temp.  no edema PULSES: dorsalis pedis intact bilat.  no carotid bruit NEURO:  cn 2-12 grossly intact.   readily moves all 4's.  sensation is intact to touch on the feet SKIN:  Normal texture and temperature.  No rash or suspicious lesion is visible.   NODES:  None palpable at the neck PSYCH: alert, well-oriented.  Does not appear anxious nor depressed.   I have reviewed outside records, and summarized:  Pt was noted to have pituitary adenoma, and referred here.  She had Sz 12/20.  Pituitary adenoma was incidentally noted on MRI.    MRI: Stable hypoenhancing pituitary lesion within the right side of the pituitary gland measuring 9 x 11 x 8 mm (AP by trans by SI).  Deviation of pituitary stalk to the left side.  No definite mass-effect upon overlying optic chiasm.  Findings consistent with pituitary adenoma.      Assessment & Plan:  Pituitary adenoma, new to me.    Patient Instructions  You should do a "dexamethasone suppression test."  For this, you would take dexamethasone 1 mg at 10 pm (I have sent a prescription to your pharmacy), then come in for a "cortisol" blood test the next morning before 9 am.  You do not need to be fasting for this test. If as expected, these tests are normal, please come back for a follow-up appointment in 1 year.

## 2019-05-16 NOTE — Patient Instructions (Signed)
You should do a "dexamethasone suppression test."  For this, you would take dexamethasone 1 mg at 10 pm (I have sent a prescription to your pharmacy), then come in for a "cortisol" blood test the next morning before 9 am.  You do not need to be fasting for this test. If as expected, these tests are normal, please come back for a follow-up appointment in 1 year.

## 2019-05-19 ENCOUNTER — Telehealth: Payer: Self-pay | Admitting: Endocrinology

## 2019-05-19 NOTE — Telephone Encounter (Signed)
please contact patient: Please see a visionspecialist.  This is routine, for the pituitary condition.  you will receive a phone call, about a day and time for an appointment

## 2019-05-21 ENCOUNTER — Other Ambulatory Visit: Payer: Self-pay

## 2019-05-21 NOTE — Telephone Encounter (Signed)
Called pt to make her aware of Dr. Cordelia Pen request. Per pt, she has already seen a vision specialist 2 weeks prior to seeing Dr. Loanne Drilling, Provided pt with fax # and asked that their office fax the notes for his review. To ensure appropriate exam has been performed, will await notes before proceeding with processing referral.

## 2019-05-22 ENCOUNTER — Other Ambulatory Visit: Payer: BC Managed Care – PPO

## 2019-05-22 NOTE — Telephone Encounter (Signed)
Received eye exam office notes from [facility]. Provided records to Dr. Loanne Drilling for his review. Returned records and stated that he did not see that a "[place]" had been completed. Called pt to make her aware. States she believes that exam was done as well. Further states she will call and request those records be faxed as well.

## 2019-05-23 ENCOUNTER — Other Ambulatory Visit: Payer: Self-pay

## 2019-05-23 ENCOUNTER — Telehealth: Payer: Self-pay | Admitting: Diagnostic Neuroimaging

## 2019-05-23 NOTE — Telephone Encounter (Addendum)
Patient called to check if she could come and pick up her paperwork as she has not been able to receive it electronically for the form 703

## 2019-05-23 NOTE — Telephone Encounter (Signed)
Pt copy @ the front desk for p/u.

## 2019-05-24 ENCOUNTER — Other Ambulatory Visit (INDEPENDENT_AMBULATORY_CARE_PROVIDER_SITE_OTHER): Payer: BC Managed Care – PPO

## 2019-05-24 DIAGNOSIS — D352 Benign neoplasm of pituitary gland: Secondary | ICD-10-CM | POA: Diagnosis not present

## 2019-05-24 LAB — BASIC METABOLIC PANEL
BUN: 16 mg/dL (ref 6–23)
CO2: 28 mEq/L (ref 19–32)
Calcium: 9.7 mg/dL (ref 8.4–10.5)
Chloride: 105 mEq/L (ref 96–112)
Creatinine, Ser: 0.98 mg/dL (ref 0.40–1.20)
GFR: 71.6 mL/min (ref >60.00)
Glucose, Bld: 101 mg/dL — ABNORMAL HIGH (ref 70–99)
Potassium: 4.5 mEq/L (ref 3.5–5.1)
Sodium: 138 mEq/L (ref 135–145)

## 2019-05-24 LAB — FOLLICLE STIMULATING HORMONE: FSH: 47.2 m[IU]/mL

## 2019-05-24 LAB — CORTISOL: Cortisol, Plasma: 0.5 ug/dL

## 2019-05-24 LAB — LUTEINIZING HORMONE: LH: 43.03 m[IU]/mL

## 2019-05-25 ENCOUNTER — Other Ambulatory Visit: Payer: Self-pay

## 2019-05-28 LAB — INSULIN-LIKE GROWTH FACTOR
IGF-I, LC/MS: 171 ng/mL (ref 50–317)
Z-Score (Female): 0.4 SD (ref ?–2.0)

## 2019-05-28 LAB — TEST AUTHORIZATION

## 2019-05-28 LAB — PROLACTIN: Prolactin: 6.7 ng/mL

## 2019-05-29 ENCOUNTER — Telehealth: Payer: Self-pay

## 2019-05-29 NOTE — Telephone Encounter (Signed)
-----   Message from Renato Shin, MD sent at 05/28/2019  7:43 PM EDT ----- please contact patient: Normal.  Nor further testing or treatment is needed now.  Please come back for a follow-up appointment in 1 year.

## 2019-05-29 NOTE — Telephone Encounter (Signed)
LAB RESULTS  Lab results were reviewed by Dr. Ellison. A letter has been mailed to pt home address. For future reference, letter can be found in Epic. 

## 2019-06-04 ENCOUNTER — Telehealth: Payer: Self-pay

## 2019-06-04 NOTE — Telephone Encounter (Signed)
[place] and they state that she did have a Visual Field Exam done and they are going to fax over the exam.

## 2019-06-04 NOTE — Telephone Encounter (Signed)
Received visual field results from [facility] but did not receive interpretation of the exam performed. Called [facility] and requested interpretation be faxed to [phone]. Will await report for Dr. Loanne Drilling to review and address.

## 2019-06-05 NOTE — Telephone Encounter (Signed)
Received VF interpretation and placed on Dr. Cordelia Pen desk for his review. Dr. Loanne Drilling reviewed and signed document. No further orders or recommendations received. Documents. Have been labeled and sent to HIM for scanning purposes and for our future reference.

## 2019-06-19 ENCOUNTER — Telehealth: Payer: Self-pay | Admitting: *Deleted

## 2019-06-19 NOTE — Telephone Encounter (Signed)
STD forms completed, signed and sent to medical records for processing.

## 2019-06-19 NOTE — Telephone Encounter (Signed)
Received STD forms from patient. Placed on MD's desk for review, signature.

## 2019-06-21 ENCOUNTER — Telehealth: Payer: Self-pay | Admitting: *Deleted

## 2019-06-21 NOTE — Telephone Encounter (Signed)
Pt short term disability form @ front desk for p/u.

## 2019-07-02 NOTE — Telephone Encounter (Signed)
Patient is calling about her STD forms and her Jobs states that she needs fitness for  duty certificate release note to return to work with no restrictions .

## 2019-07-02 NOTE — Telephone Encounter (Signed)
Follow up with PCP for syncope eval; consider cardiology eval. -VRP

## 2019-07-03 ENCOUNTER — Encounter: Payer: Self-pay | Admitting: *Deleted

## 2019-07-03 NOTE — Telephone Encounter (Signed)
Called patient who stated she received e mail from work stating she needs letter from dr who took her out of work that she is fit to return to work with no restrictions. I asked if she saw PCP or cardiology after seeing Dr Leta Baptist; she did see cardiology. I advised her that cardiologist will need to release her to work. She stated that because Dr Leta Baptist took her out of work for 6 months, he has to write letter too. I advised her he may write stating she did not have a seizure, can return 6 months following syncopal event (07/08/19) per [place] DMV law. However she must be cleared by cardiology as well. She stated she will call her PCP and cardiologist, asking to pick up letter from Dr Leta Baptist on 07/09/19.  Patient verbalized understanding, appreciation.

## 2019-07-03 NOTE — Telephone Encounter (Addendum)
Called patient for information re: person that requested letter. It was the Benefits coordinator, Vista Mink,  [phone], e mail: [email or web address].  Butch Penny does paperwork for employees going out of work and returning to work for [employer].  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 Patient stated she called cardiologist who stated they read echo (ordered by PCP)  and since they didn't see anything she never was seen by a cardiologist. She called her PCP who told her she wasn't seen/evaluated by PCP for this issue, so they would not write letter. Patient asked for call back when letter ready.

## 2019-07-03 NOTE — Telephone Encounter (Signed)
Agree. -VRP 

## 2019-07-05 NOTE — Telephone Encounter (Signed)
Letter written per Dr Leta Baptist for return to work, ready for [employer].

## 2019-07-05 NOTE — Telephone Encounter (Signed)
Patient stated she wanted to know if there a letter ready for her work.

## 2019-07-05 NOTE — Telephone Encounter (Signed)
Called patient's supervisor, Brooke Dare [phone], LVM requesting he call back. Advised him Dr Leta Baptist needs to speak with him before writing a letter to release a patient to go back to work.

## 2019-07-05 NOTE — Telephone Encounter (Signed)
I spoke with patient. She has been seizure / syncope free for past 6 months since her event on 01/08/19. she may return to driving her personal vehicle on 07/09/18. 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  Penni Bombard, MD 4/98/2641, 5:83 PM Certified in Neurology, Neurophysiology and Neuroimaging  [facility] [address], [place], [place] [address] [phone]

## 2019-07-05 NOTE — Telephone Encounter (Signed)
Called patient and advised her that neither Dr Leta Baptist or I have been able to reach Mr Janet [name]. We have LVM. She stated she talked to him and he told her he isn't at liberty to dicuss her medical condition with Dr Leta Baptist. I advised her Dr Leta Baptist doesn't need to discuss her medical history but needs to talk to him before he writes a letter releasing her to work. She stated maybe he'll reply to e mail: [email or web address].

## 2019-07-05 NOTE — Telephone Encounter (Signed)
Letter signed and patient picked it up today.

## 2019-07-18 ENCOUNTER — Other Ambulatory Visit: Payer: Self-pay | Admitting: Family Medicine

## 2019-07-18 DIAGNOSIS — R1084 Generalized abdominal pain: Secondary | ICD-10-CM

## 2019-07-18 NOTE — Telephone Encounter (Signed)
Per Dr Leta Baptist, called patient and requested DOT medical examiner's name, #. She gave Dr Dannial Monarch, phone #s [phone] and [phone].

## 2019-07-18 NOTE — Telephone Encounter (Signed)
Spoke with patient who stated she passed DOT physical. She still has to have letter from neurologist to release her to work. She stated this came from her employer's HR when she showed her DOT papers. She also asked that her STD be filled out again since she still isn't able to go to work. I advised will discuss with Dr Leta Baptist and can fill out disability again. Patient verbalized understanding, appreciation.

## 2019-07-18 NOTE — Telephone Encounter (Signed)
Pt is asking for a call from Rogue Jury re: her being released by Dr Leta Baptist to return to work.  Please call

## 2019-07-19 ENCOUNTER — Ambulatory Visit
Admission: RE | Admit: 2019-07-19 | Discharge: 2019-07-19 | Disposition: A | Discharge status: Home / Self Care | Payer: BC Managed Care – PPO | Source: Ambulatory Visit | Attending: Family Medicine | Admitting: Family Medicine

## 2019-07-19 DIAGNOSIS — R1084 Generalized abdominal pain: Secondary | ICD-10-CM

## 2019-07-24 ENCOUNTER — Telehealth: Payer: Self-pay | Admitting: *Deleted

## 2019-07-24 NOTE — Telephone Encounter (Signed)
I called to discuss case; they will call me back. -VRP

## 2019-07-24 NOTE — Telephone Encounter (Signed)
Patient requested that her STD forms be continued since she has not returned to work yet. STD forms completed, signed and sent to medical records for processing.

## 2019-07-24 NOTE — Telephone Encounter (Signed)
Pt called to speak with Leilani Able, RN Regarding her returning to work.

## 2019-07-25 NOTE — Telephone Encounter (Signed)
LVM informing patient Dr Leta Baptist tried to reach Dr Quay Burow yesterday, and he did speak to someone. The person was to give Dr Quay Burow the message to call Dr Leta Baptist back.

## 2019-07-26 NOTE — Telephone Encounter (Addendum)
Called # to DOT, spoke with Merrily Pew who gave direct [phone] for Dr Quay Burow. I called Dr Quay Burow and requested to schedule phone call with Dr Leta Baptist. She stated her last exam today is 2:30, lasting 30 minutes. Dr Leta Baptist may call  her direct # between 3-5 pm today to speak with her. Will advise.

## 2019-07-26 NOTE — Telephone Encounter (Signed)
I spoke with Dannial Monarch, NP who is the DOT medical examiner.  I explained the patient had a single unprovoked seizure versus syncopal event in December 2020.  Her testing and evaluation so far has been unremarkable.  I think she is cleared to return to driving her personal vehicle as she has been event free for 6 months.  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  I would defer this decision to DOT / [employer] guidelines.   I will ask my staff to forward a copy of my last office note as well as the ER notes for their review. (fax number [phone]; attn DOT examiner Dannial Monarch).   Penni Bombard, MD 08/21/938, 7:68 PM Certified in Neurology, Neurophysiology and Neuroimaging  [facility] [address] [place], [place] [address] [phone]

## 2019-07-30 NOTE — Telephone Encounter (Addendum)
[facility]le 01/08/2019 ED note and Dr Gladstone Lighter office note dated 01/24/19 faxed to Dannial Monarch, NP/DOT. Fax didn't go through after multiple attempts. Called Jeff Harris/ assistant who gave new fax #, [phone]. Unable to get fax to go through with new #. Vinie Sill who asked that documents be scanned and e mailed ot him: [email or web address].  Advised will have medical records e mail to him. Merry Proud will forward to Dannial Monarch, NP, verbalized understanding, appreciation.

## 2019-07-31 ENCOUNTER — Ambulatory Visit: Payer: BC Managed Care – PPO | Admitting: Diagnostic Neuroimaging

## 2019-08-01 NOTE — Telephone Encounter (Signed)
Discussed with Dr Leta Baptist. He is okay to release her. Called patient and advised her. She stated her HR has told her Dr Leta Baptist must send a letter to her PCP releasing her to her PCP's care. She will need PCP to continue her disability papers if needed or PCP to release her back to work. Patient also needs copy of letter e mailed to her for her to take to her HR dept.  Advised will let Dr Leta Baptist know.

## 2019-08-01 NOTE — Telephone Encounter (Signed)
Patient called back about this today. She states HR has told her that because Dr. Leta Baptist will not release her to go back to work, he can release her to her PCP and from there her PCP can make the decision for her to go back to work. Patient states "Dr. Leta Baptist can release me, he just doesn't want to." I advised patient that RN or MD will need to call her back about this. FYI

## 2019-08-01 NOTE — Telephone Encounter (Signed)
Agree. -VRP 

## 2019-08-02 NOTE — Telephone Encounter (Signed)
I called Dr. Justin Mend (patient's PCP). Patient had an unprovoked episode of loss of consciousness in December 2020, possibly seizure versus syncope. Extensive neurologic and cardiac testing and work-up have been unremarkable. Unclear whether this was a unprovoked seizure or unprovoked syncopal event. DOT guidelines indicate that for new onset unprovoked seizures there must be a 4-year seizure-free period before returning to driving DOT vehicle. For new onset unprovoked syncopal events, waiting time is at discretion of patient's physicians.  Based on patient's symptoms and test results, Dr. 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 We will see patient in follow-up after 01/08/2020 for follow-up visit. 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   Penni Bombard, MD 6/77/0340, 3:52 PM Certified in Neurology, Neurophysiology and Neuroimaging  [facility] [address] [place], [place] [address] [phone]

## 2019-08-02 NOTE — Telephone Encounter (Signed)
Called PCP's office, spoke with Colletta Maryland and requested to schedule time for Dr Leta Baptist to speak with Dr Justin Mend. Colletta Maryland will send message to Dr Jason Nest nurse, request Dr Justin Mend call between 11-12 noon today. I gave [facility] office #.

## 2019-08-06 NOTE — Telephone Encounter (Addendum)
Spoke with patient and informed her of PCP and Dr Gladstone Lighter conversation and decision. She stated she spoke with her PCP about this.  We scheduled her FU for soonest she can come after 01/07/21. She stated her HR dept needs a letter to extend her  leave from work until 01/23/2020 pending follow up with neurology and no further seizure, syncopal events.  The letter can be faxed to [place], Vista Mink,  [phone]. Her Disability papers for [place] also need to be filled out monthly until she returns to work. She verbalized understanding, appreciation.

## 2019-08-06 NOTE — Telephone Encounter (Signed)
Pt called and LVM stating that she is needing a letter for work stating the date and the information for her leave. Please advise.

## 2019-08-06 NOTE — Telephone Encounter (Signed)
See previous note, letter has been faxed.

## 2019-08-06 NOTE — Telephone Encounter (Signed)
Extended LOA letter signed, faxed to [employer] HR dept. Received confirmation.

## 2019-08-15 ENCOUNTER — Telehealth: Payer: Self-pay | Admitting: *Deleted

## 2019-08-15 NOTE — Telephone Encounter (Signed)
FMLA papers signed, sent to medical records for processing.

## 2019-08-15 NOTE — Telephone Encounter (Signed)
Received [place] [employer] FMLA papers for spouse. Placed on Dr [employer] desk for review, signature.

## 2019-08-28 ENCOUNTER — Telehealth: Payer: Self-pay | Admitting: *Deleted

## 2019-08-28 NOTE — Telephone Encounter (Signed)
Called patient to discuss e mailed request to complete STD today. She stated it is due by 10th of every month. I advised Dr Leta Baptist is out of office until next Monday. I can ask work in MD to sign on his behalf. She stated she'd call and ask STD company if that is acceptable. Dr Felecia Shelling signed STD, papers at front desk for pick up. Medical records called patient to advise her.

## 2019-08-29 ENCOUNTER — Emergency Department (HOSPITAL_COMMUNITY)
Admission: EM | Admit: 2019-08-29 | Discharge: 2019-08-29 | Disposition: A | Discharge status: Home / Self Care | Payer: BC Managed Care – PPO | Attending: Emergency Medicine | Admitting: Emergency Medicine

## 2019-08-29 ENCOUNTER — Encounter (HOSPITAL_COMMUNITY): Payer: Self-pay

## 2019-08-29 ENCOUNTER — Other Ambulatory Visit: Payer: Self-pay

## 2019-08-29 DIAGNOSIS — Z5321 Procedure and treatment not carried out due to patient leaving prior to being seen by health care provider: Secondary | ICD-10-CM | POA: Insufficient documentation

## 2019-08-29 DIAGNOSIS — Z8719 Personal history of other diseases of the digestive system: Secondary | ICD-10-CM | POA: Diagnosis not present

## 2019-08-29 DIAGNOSIS — R1012 Left upper quadrant pain: Secondary | ICD-10-CM | POA: Insufficient documentation

## 2019-08-29 NOTE — ED Triage Notes (Signed)
Pt BIB EMS [place] side of highway, pt pulled over from her car. Pt c/o sharp pain to LUQ. Pt has hx of hernia and hernia surgery to this area.

## 2019-09-18 ENCOUNTER — Other Ambulatory Visit: Payer: Self-pay | Admitting: Family Medicine

## 2019-09-18 DIAGNOSIS — R1032 Left lower quadrant pain: Secondary | ICD-10-CM

## 2019-09-19 ENCOUNTER — Telehealth: Payer: Self-pay | Admitting: *Deleted

## 2019-09-19 NOTE — Telephone Encounter (Signed)
STD papers signed, sent to medical records for processing.  °

## 2019-09-19 NOTE — Telephone Encounter (Signed)
Patient's monthly STD papers on MD's desk for signature.

## 2019-09-20 ENCOUNTER — Telehealth: Payer: Self-pay | Admitting: *Deleted

## 2019-09-20 NOTE — Telephone Encounter (Signed)
Pt form @ the front desk for p/u. 

## 2019-10-05 ENCOUNTER — Other Ambulatory Visit: Payer: Self-pay | Admitting: Family Medicine

## 2019-10-05 DIAGNOSIS — M5432 Sciatica, left side: Secondary | ICD-10-CM

## 2019-10-05 DIAGNOSIS — R1032 Left lower quadrant pain: Secondary | ICD-10-CM

## 2019-10-11 ENCOUNTER — Other Ambulatory Visit: Payer: BC Managed Care – PPO

## 2019-10-19 ENCOUNTER — Other Ambulatory Visit: Payer: BC Managed Care – PPO

## 2019-10-22 ENCOUNTER — Telehealth: Payer: Self-pay | Admitting: *Deleted

## 2019-10-22 NOTE — Telephone Encounter (Signed)
STD papers on MD desk for signature.

## 2019-10-23 ENCOUNTER — Inpatient Hospital Stay: Admission: RE | Admit: 2019-10-23 | Payer: BC Managed Care – PPO | Source: Ambulatory Visit

## 2019-10-23 NOTE — Telephone Encounter (Signed)
STD papers completed, signed and sent to medical records for processing.

## 2019-11-19 ENCOUNTER — Telehealth: Payer: Self-pay | Admitting: *Deleted

## 2019-11-19 NOTE — Telephone Encounter (Signed)
STD papers on MD desk for review, signature.

## 2019-11-20 NOTE — Telephone Encounter (Signed)
STD papers signed, sent to medical records for processing.

## 2019-12-18 ENCOUNTER — Telehealth: Payer: Self-pay | Admitting: *Deleted

## 2019-12-18 NOTE — Telephone Encounter (Signed)
STD papers signed, sent to medical records for processing.

## 2020-01-16 ENCOUNTER — Telehealth: Payer: Self-pay | Admitting: *Deleted

## 2020-01-16 NOTE — Telephone Encounter (Signed)
STD paper on MD desk for signature when he returns to office 01/21/20.

## 2020-01-21 NOTE — Telephone Encounter (Signed)
STD paper signed, sent to medical records for processing.

## 2020-01-23 ENCOUNTER — Encounter: Payer: Self-pay | Admitting: *Deleted

## 2020-01-23 ENCOUNTER — Ambulatory Visit (INDEPENDENT_AMBULATORY_CARE_PROVIDER_SITE_OTHER): Payer: Self-pay | Admitting: Diagnostic Neuroimaging

## 2020-01-23 ENCOUNTER — Telehealth: Payer: Self-pay | Admitting: Diagnostic Neuroimaging

## 2020-01-23 ENCOUNTER — Encounter: Payer: Self-pay | Admitting: Diagnostic Neuroimaging

## 2020-01-23 VITALS — BP 116/67 | HR 79 | Ht 65.5 in | Wt 275.0 lb

## 2020-01-23 DIAGNOSIS — R4020 Unspecified coma: Secondary | ICD-10-CM

## 2020-01-23 NOTE — Telephone Encounter (Signed)
Letter written as patient requested, signed and faxed to # she provided, received confirmation. Called her and advised, Patient verbalized understanding, appreciation.

## 2020-01-23 NOTE — Progress Notes (Signed)
[facility]  PATIENT: Janet [name] DOB: 01-29-1965  REFERRING CLINICIAN: Maurice Small, MD HISTORY FROM: patient REASON FOR VISIT: follow up   HISTORICAL  CHIEF COMPLAINT:  Chief Complaint  Patient presents with  . Loss of Consciousness    Rm 7 One year FU "doing well"    HISTORY OF PRESENT ILLNESS:   UPDATE (01/23/20, VRP): Since last visit, doing well. No more events or spells. No alleviating or aggravating factors.   PRIOR HPI (01/24/19): 55 year old female here for evaluation of seizure or syncope. 01/08/19 at 7 PM patient went into a game room to play sweepstakes on a computer. She had sat down and put in her Monday when all of a sudden she lost consciousness. Bystanders reported that she was shaking for a minute or two. No tongue biting or incontinence. Patient recovered very quickly and felt back to normal. She was taken to the hospital by her husband. CT scan and lab testing were unremarkable. Patient was diagnosed with possible new onset seizure for syncope.  In retrospect patient may have felt "hot" just prior to the event. She did not feel lightheaded or dizzy. She did not have palpitations. No prior similar events. No family history of seizure.  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   REVIEW OF SYSTEMS: Full 14 system review of systems performed and negative with exception of: As per HPI.  ALLERGIES: Allergies  Allergen Reactions  . Bee Venom Swelling    Never been stung by bees  . Shellfish Allergy Swelling  . Tramadol Itching and Nausea And Vomiting    HOME MEDICATIONS: Outpatient Medications Prior to Visit  Medication Sig Dispense Refill  . HYDROcodone-acetaminophen (NORCO) 7.5-325 MG tablet Take 1-2 tablets by mouth every 6 (six) hours as needed.    . Multiple Vitamin (MULTIVITAMIN) tablet Take 1 tablet by mouth daily. bariatric    . dexamethasone (DECADRON) 1 MG tablet Take 1 tablet (1 mg total) by mouth See  admin instructions. Takes at 9-10 PM, the night before blood test. 1 tablet 0   No facility-administered medications prior to visit.    PAST MEDICAL HISTORY: Past Medical History:  Diagnosis Date  . Chronic abdominal pain    Once a month, lasts 3-4 days, could have been related to IUD, now resolved.  . Chronic venous insufficiency    2D ECHO EF 55-60%. Nl LFT's, Nl renal fxn. Refused conpression stockings.  . Community acquired pneumonia 08/2013  . GERD (gastroesophageal reflux disease)   . Heme positive stool    Colonoscopy done, BE 12/2004, diverticulosis in sigmoid. EGD 12/2004 normal.  . Obesity   . Ovarian enlargement, left 01/2005   4.7/3 cm on CT  . Seizures ([place])   . Spontaneous abortion 2003   at 3 months gestation    PAST SURGICAL HISTORY: Past Surgical History:  Procedure Laterality Date  . CARPAL TUNNEL RELEASE Bilateral   . CESAREAN SECTION     x 2  . gastric bypss  01/04/2014    FAMILY HISTORY: Family History  Problem Relation Age of Onset  . Kidney disease Mother   . Diabetes Mother   . Heart disease Mother        CHF  . Hypertension Mother   . Rheum arthritis Mother   . Heart disease Father        CHF  . Alcohol abuse Father   . Hypertension Father   . Lupus Brother   .  Colon cancer Sister   . Other Neg Hx        pituitary disorder    SOCIAL HISTORY: Social History   Socioeconomic History  . Marital status: Married    Spouse name: Tinnie Gens  . Number of children: 2  . Years of education: Not on file  . Highest education level: Some college, no degree  Occupational History    Comment: CNA  Tobacco Use  . Smoking status: Never Smoker  . Smokeless tobacco: Never Used  Substance and Sexual Activity  . Alcohol use: No  . Drug use: No  . Sexual activity: Yes  Other Topics Concern  . Not on file  Social History Narrative   Lives at home with husband, child. Works as a Theatre manager. Has a son that graduated [employer]. Pt is also  working towards business degree.    Caffeine- coffee 1 c daily   Social Determinants of Health   Financial Resource Strain: Not on file  Food Insecurity: Not on file  Transportation Needs: Not on file  Physical Activity: Not on file  Stress: Not on file  Social Connections: Not on file  Intimate Partner Violence: Not on file     PHYSICAL EXAM  GENERAL EXAM/CONSTITUTIONAL: Vitals:  Vitals:   01/23/20 1421  BP: 116/67  Pulse: 79  Weight: 275 lb (124.7 kg)  Height: 5' 5.5" (1.664 m)   Body mass index is 45.07 kg/m. Wt Readings from Last 3 Encounters:  01/23/20 275 lb (124.7 kg)  05/16/19 285 lb (129.3 kg)  01/24/19 280 lb 0.3 oz (127 kg)    Patient is in no distress; well developed, nourished and groomed; neck is supple  CARDIOVASCULAR:  Examination of carotid arteries is normal; no carotid bruits  Regular rate and rhythm, no murmurs  Examination of peripheral vascular system by observation and palpation is normal  EYES:  Ophthalmoscopic exam of optic discs and posterior segments is normal; no papilledema or hemorrhages No exam data present  MUSCULOSKELETAL:  Gait, strength, tone, movements noted in Neurologic exam below  NEUROLOGIC: MENTAL STATUS:  No flowsheet data found.  awake, alert, oriented to person, place and time  recent and remote memory intact  normal attention and concentration  language fluent, comprehension intact, naming intact  fund of knowledge appropriate  CRANIAL NERVE:   2nd - no papilledema on fundoscopic exam  2nd, 3rd, 4th, 6th - pupils equal and reactive to light, visual fields full to confrontation, extraocular muscles intact, no nystagmus  5th - facial sensation symmetric  7th - facial strength symmetric  8th - hearing intact  9th - palate elevates symmetrically, uvula midline  11th - shoulder shrug symmetric  12th - tongue protrusion midline  MOTOR:   normal bulk and tone, full strength in the BUE,  BLE  SENSORY:   normal and symmetric to light touch, temperature, vibration  COORDINATION:   finger-nose-finger, fine finger movements normal  REFLEXES:   deep tendon reflexes present and symmetric  GAIT/STATION:   narrow based gait    DIAGNOSTIC DATA (LABS, IMAGING, TESTING) - I reviewed patient records, labs, notes, testing and imaging myself where available.  Lab Results  Component Value Date   WBC 7.3 01/08/2019   HGB 12.2 01/08/2019   HCT 39.1 01/08/2019   MCV 88.7 01/08/2019   PLT 360 01/08/2019      Component Value Date/Time   NA 138 05/24/2019 0828   K 4.5 05/24/2019 0828   CL 105 05/24/2019 0828  CO2 28 05/24/2019 0828   GLUCOSE 101 (H) 05/24/2019 0828   BUN 16 05/24/2019 0828   CREATININE 0.98 05/24/2019 0828   CALCIUM 9.7 05/24/2019 0828   PROT 7.7 01/08/2019 2301   ALBUMIN 4.1 01/08/2019 2301   AST 23 01/08/2019 2301   ALT 20 01/08/2019 2301   ALKPHOS 68 01/08/2019 2301   BILITOT 0.6 01/08/2019 2301   GFRNONAA 58 (L) 01/08/2019 2301   GFRAA >60 01/08/2019 2301   Lab Results  Component Value Date   CHOL 161 02/01/2008   HDL 33 (L) 02/01/2008   LDLCALC 99 02/01/2008   TRIG 143 02/01/2008   CHOLHDL 4.9 Ratio 02/01/2008   Lab Results  Component Value Date   HGBA1C 5.0 02/14/2008   No results found for: VITAMINB12 Lab Results  Component Value Date   TSH 3.129 08/01/2007    01/08/19 CT head [I reviewed images myself and agree with interpretation. -VRP] - normal  01/31/19 EEG - normal  02/16/19 MRI brain 1.   Few scattered T2/FLAIR hyperintense foci in the hemispheres consistent with very minimal chronic microvascular ischemic change.  None of the foci appear to be acute. 2.   Right pituitary nodule that enhances less than surrounding pituitary gland measuring about 5.5 mm.  This is consistent with a pituitary microadenoma.  Consider further evaluation. 3.   There were no acute findings.  04/18/19 MRI brain -Stable hypoenhancing  pituitary lesion within the right side of the pituitary gland measuring 9 x 11 x 8 mm (AP by trans by SI).  Deviation of pituitary stalk to the left side.  No definite mass-effect upon overlying optic chiasm.  Findings consistent with pituitary adenoma. -Brain parenchyma otherwise unremarkable.    ASSESSMENT AND PLAN  55 y.o. year old female here with new onset episode of loss of consciousness with convulsions. Could represent new onset seizure or syncope. No significant prodromal symptoms other than slight "hot feeling". Also no prolonged postictal confusion.   Dx: possible syncope vs seizure (01/08/19)  No diagnosis found.   PLAN:   LOSS OF CONSCIOUSNESS - I spoke with Dr. Justin Mend (patient's PCP) back in July 2021. Patient had an unprovoked episode of loss of consciousness in December 2020, possibly seizure versus syncope. Extensive neurologic and cardiac testing and work-up have been unremarkable. Unclear whether this was a unprovoked seizure or unprovoked syncopal event. DOT guidelines indicate that for new onset unprovoked seizures there must be a 4-year seizure-free period before returning to driving DOT vehicle. For new onset unprovoked syncopal events, waiting time is at discretion of patient's physicians.  - Based on patient's symptoms and test results, Dr. 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   - Patient doing well, and no further events since 01/08/19. 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     Penni Bombard, MD [id number], [id number] PM Certified in Neurology, Neurophysiology and Neuroimaging  [facility] [address] [place], [place] [address] [phone]

## 2020-01-23 NOTE — Patient Instructions (Signed)
-   Patient doing well, and no further events since 01/08/19. 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

## 2020-01-23 NOTE — Telephone Encounter (Signed)
Pt. 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 She asks that she is let known when it is sent.  Their fax number is [phone].

## 2020-03-03 ENCOUNTER — Other Ambulatory Visit: Payer: Self-pay

## 2020-03-03 ENCOUNTER — Emergency Department (HOSPITAL_COMMUNITY)
Admission: EM | Admit: 2020-03-03 | Discharge: 2020-03-04 | Discharge status: Against Medical Advice | Payer: BC Managed Care – PPO

## 2020-03-03 NOTE — ED Notes (Signed)
Pt stated she was not going to wait

## 2020-05-15 ENCOUNTER — Ambulatory Visit: Payer: BC Managed Care – PPO | Admitting: Endocrinology

## 2020-07-08 ENCOUNTER — Other Ambulatory Visit: Payer: Self-pay | Admitting: Family Medicine

## 2020-07-08 ENCOUNTER — Other Ambulatory Visit: Payer: Self-pay

## 2020-07-08 ENCOUNTER — Ambulatory Visit
Admission: RE | Admit: 2020-07-08 | Discharge: 2020-07-08 | Disposition: A | Discharge status: Home / Self Care | Payer: BC Managed Care – PPO | Source: Ambulatory Visit | Attending: Family Medicine | Admitting: Family Medicine

## 2020-07-08 DIAGNOSIS — Z1231 Encounter for screening mammogram for malignant neoplasm of breast: Secondary | ICD-10-CM

## 2020-07-08 DIAGNOSIS — N63 Unspecified lump in unspecified breast: Secondary | ICD-10-CM

## 2020-08-07 ENCOUNTER — Other Ambulatory Visit (HOSPITAL_COMMUNITY)
Admission: RE | Admit: 2020-08-07 | Discharge: 2020-08-07 | Disposition: A | Discharge status: Home / Self Care | Payer: BC Managed Care – PPO | Source: Ambulatory Visit | Attending: Family Medicine | Admitting: Family Medicine

## 2020-08-07 DIAGNOSIS — Z01411 Encounter for gynecological examination (general) (routine) with abnormal findings: Secondary | ICD-10-CM | POA: Insufficient documentation

## 2020-08-08 LAB — CYTOLOGY - PAP
Adequacy: ABSENT
Comment: NEGATIVE
Diagnosis: NEGATIVE
High risk HPV: NEGATIVE

## 2020-10-08 ENCOUNTER — Ambulatory Visit
Admission: RE | Admit: 2020-10-08 | Discharge: 2020-10-08 | Disposition: A | Discharge status: Home / Self Care | Payer: BC Managed Care – PPO | Source: Ambulatory Visit | Attending: Family Medicine | Admitting: Family Medicine

## 2020-10-08 ENCOUNTER — Other Ambulatory Visit: Payer: Self-pay

## 2020-10-08 DIAGNOSIS — N63 Unspecified lump in unspecified breast: Secondary | ICD-10-CM

## 2021-10-15 ENCOUNTER — Other Ambulatory Visit (HOSPITAL_COMMUNITY): Payer: Self-pay

## 2021-10-15 MED ORDER — WEGOVY 0.25 MG/0.5ML ~~LOC~~ SOAJ
0.2500 mg | SUBCUTANEOUS | 0 refills | Status: AC
  Filled 2021-10-15 – 2021-10-28 (×3): qty 2, 28d supply, fill #0

## 2021-10-15 MED ORDER — WEGOVY 0.5 MG/0.5ML ~~LOC~~ SOAJ
0.5000 mg | SUBCUTANEOUS | 0 refills | Status: AC
  Filled 2021-10-15: qty 2, 28d supply, fill #0

## 2021-10-15 MED ORDER — WEGOVY 1 MG/0.5ML ~~LOC~~ SOAJ
1.0000 mg | SUBCUTANEOUS | 0 refills | Status: AC
  Filled 2021-10-15: qty 2, 28d supply, fill #0

## 2021-10-20 ENCOUNTER — Other Ambulatory Visit (HOSPITAL_COMMUNITY): Payer: Self-pay

## 2021-10-28 ENCOUNTER — Other Ambulatory Visit (HOSPITAL_COMMUNITY): Payer: Self-pay

## 2021-11-19 ENCOUNTER — Other Ambulatory Visit (HOSPITAL_COMMUNITY): Payer: Self-pay

## 2021-12-14 ENCOUNTER — Other Ambulatory Visit (HOSPITAL_COMMUNITY): Payer: Self-pay

## 2022-01-05 ENCOUNTER — Other Ambulatory Visit (HOSPITAL_COMMUNITY): Payer: Self-pay

## 2022-01-06 ENCOUNTER — Other Ambulatory Visit (HOSPITAL_COMMUNITY): Payer: Self-pay

## 2022-01-19 ENCOUNTER — Other Ambulatory Visit (HOSPITAL_COMMUNITY): Payer: Self-pay

## 2022-01-20 ENCOUNTER — Other Ambulatory Visit (HOSPITAL_COMMUNITY): Payer: Self-pay

## 2022-02-02 ENCOUNTER — Other Ambulatory Visit (HOSPITAL_COMMUNITY): Payer: Self-pay

## 2022-02-02 MED ORDER — WEGOVY 0.5 MG/0.5ML ~~LOC~~ SOAJ
0.5000 mg | SUBCUTANEOUS | 0 refills | Status: AC
  Filled 2022-02-02 (×2): qty 2, 28d supply, fill #0

## 2022-09-07 ENCOUNTER — Encounter: Payer: Self-pay | Admitting: Family Medicine

## 2022-09-16 ENCOUNTER — Other Ambulatory Visit: Payer: Self-pay | Admitting: Family Medicine

## 2022-09-16 DIAGNOSIS — N63 Unspecified lump in unspecified breast: Secondary | ICD-10-CM

## 2022-10-27 ENCOUNTER — Ambulatory Visit
Admission: RE | Admit: 2022-10-27 | Discharge: 2022-10-27 | Disposition: A | Discharge status: Home / Self Care | Payer: BC Managed Care – PPO | Source: Ambulatory Visit | Attending: Family Medicine | Admitting: Family Medicine

## 2022-10-27 DIAGNOSIS — N63 Unspecified lump in unspecified breast: Secondary | ICD-10-CM

## 2023-08-30 ENCOUNTER — Other Ambulatory Visit (HOSPITAL_BASED_OUTPATIENT_CLINIC_OR_DEPARTMENT_OTHER): Payer: Self-pay | Admitting: Family Medicine

## 2023-08-30 DIAGNOSIS — E785 Hyperlipidemia, unspecified: Secondary | ICD-10-CM
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
## 2023-09-21 ENCOUNTER — Encounter (HOSPITAL_BASED_OUTPATIENT_CLINIC_OR_DEPARTMENT_OTHER): Payer: Self-pay

## 2023-09-21 ENCOUNTER — Other Ambulatory Visit (HOSPITAL_BASED_OUTPATIENT_CLINIC_OR_DEPARTMENT_OTHER): Payer: Self-pay

## 2023-10-15 ENCOUNTER — Other Ambulatory Visit: Payer: Self-pay

## 2023-10-15 ENCOUNTER — Encounter (HOSPITAL_COMMUNITY): Payer: Self-pay | Admitting: Pharmacy Technician

## 2023-10-15 ENCOUNTER — Emergency Department (HOSPITAL_COMMUNITY)

## 2023-10-15 ENCOUNTER — Emergency Department (HOSPITAL_COMMUNITY): Admission: EM | Admit: 2023-10-15 | Discharge: 2023-10-15 | Disposition: A | Discharge status: Home / Self Care

## 2023-10-15 DIAGNOSIS — R0609 Other forms of dyspnea: Secondary | ICD-10-CM | POA: Insufficient documentation

## 2023-10-15 DIAGNOSIS — R079 Chest pain, unspecified: Secondary | ICD-10-CM | POA: Diagnosis present

## 2023-10-15 LAB — BASIC METABOLIC PANEL WITH GFR
Anion gap: 10 (ref 5–15)
BUN: 15 mg/dL (ref 6–20)
CO2: 23 mmol/L (ref 22–32)
Calcium: 9.4 mg/dL (ref 8.9–10.3)
Chloride: 106 mmol/L (ref 98–111)
Creatinine, Ser: 0.87 mg/dL (ref 0.44–1.00)
GFR, Estimated: >60 mL/min (ref >60)
Glucose, Bld: 125 mg/dL — ABNORMAL HIGH (ref 70–99)
Potassium: 4 mmol/L (ref 3.5–5.1)
Sodium: 139 mmol/L (ref 135–145)

## 2023-10-15 LAB — CBC
HCT: 37.1 % (ref 36.0–46.0)
Hemoglobin: 11.9 g/dL — ABNORMAL LOW (ref 12.0–15.0)
MCH: 28.1 pg (ref 26.0–34.0)
MCHC: 32.1 g/dL (ref 30.0–36.0)
MCV: 87.5 fL (ref 80.0–100.0)
Platelets: 336 K/uL (ref 150–400)
RBC: 4.24 MIL/uL (ref 3.87–5.11)
RDW: 14.4 % (ref 11.5–15.5)
WBC: 5.3 K/uL (ref 4.0–10.5)
nRBC: 0 % (ref 0.0–0.2)

## 2023-10-15 LAB — TROPONIN I (HIGH SENSITIVITY)
Troponin I (High Sensitivity): 6 ng/L (ref <18)
Troponin I (High Sensitivity): 8 ng/L (ref <18)

## 2023-10-15 LAB — BRAIN NATRIURETIC PEPTIDE: B Natriuretic Peptide: 37.1 pg/mL (ref 0.0–100.0)

## 2023-10-15 LAB — D-DIMER, QUANTITATIVE: D-Dimer, Quant: 0.39 ug{FEU}/mL (ref 0.00–0.50)

## 2023-10-15 NOTE — Discharge Instructions (Addendum)
 I do think this is likely chest wall pain.  Given that I am able to reproduce the chest wall, this is reassuring.  I do think you should follow-up with cardiology given your history.  I have placed a referral.  Otherwise, your workup today is unremarkable.  If you have any worsening chest pain or shortness of breath and please come back to the ED.

## 2023-10-15 NOTE — ED Triage Notes (Signed)
 Pt here POV with reports of headache, chest pain and shob since yesterday. Pt states every time she drifts off to sleep she feels as if she is suffocating.
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
## 2023-10-15 NOTE — ED Notes (Addendum)
Snack and drink given to pt 

## 2023-10-19 ENCOUNTER — Other Ambulatory Visit: Payer: Self-pay | Admitting: Family Medicine

## 2023-10-19 DIAGNOSIS — Z0000 Encounter for general adult medical examination without abnormal findings: Secondary | ICD-10-CM

## 2023-11-07 ENCOUNTER — Ambulatory Visit

## 2023-12-13 ENCOUNTER — Ambulatory Visit
Admission: RE | Admit: 2023-12-13 | Discharge: 2023-12-13 | Disposition: A | Discharge status: Home / Self Care | Source: Ambulatory Visit | Attending: Family Medicine | Admitting: Family Medicine

## 2023-12-13 DIAGNOSIS — Z0000 Encounter for general adult medical examination without abnormal findings: Secondary | ICD-10-CM
